# Patient Record
Sex: Male | Born: 1990 | Race: Black or African American | Hispanic: No | Marital: Single | Smoking: Current some day smoker
Health system: Southern US, Community
[De-identification: ages and names within clinical notes are randomized; demographics above are authoritative.]

## PROBLEM LIST (undated history)

## (undated) DIAGNOSIS — F209 Schizophrenia, unspecified: Secondary | ICD-10-CM

## (undated) DIAGNOSIS — F909 Attention-deficit hyperactivity disorder, unspecified type: Secondary | ICD-10-CM

## (undated) DIAGNOSIS — F319 Bipolar disorder, unspecified: Secondary | ICD-10-CM

## (undated) HISTORY — PX: KNEE ARTHROSCOPY: SHX127

---
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
## 2011-01-12 ENCOUNTER — Emergency Department (HOSPITAL_COMMUNITY)
Admission: EM | Admit: 2011-01-12 | Discharge: 2011-01-12 | Disposition: A | Discharge status: Home / Self Care | Payer: Medicaid Other | Attending: Emergency Medicine | Admitting: Emergency Medicine

## 2011-01-12 DIAGNOSIS — L2989 Other pruritus: Secondary | ICD-10-CM | POA: Insufficient documentation

## 2011-01-12 DIAGNOSIS — F909 Attention-deficit hyperactivity disorder, unspecified type: Secondary | ICD-10-CM | POA: Insufficient documentation

## 2011-01-12 DIAGNOSIS — T7840XA Allergy, unspecified, initial encounter: Secondary | ICD-10-CM

## 2011-01-12 DIAGNOSIS — L259 Unspecified contact dermatitis, unspecified cause: Secondary | ICD-10-CM | POA: Insufficient documentation

## 2011-01-12 DIAGNOSIS — I10 Essential (primary) hypertension: Secondary | ICD-10-CM | POA: Insufficient documentation

## 2011-01-12 DIAGNOSIS — R21 Rash and other nonspecific skin eruption: Secondary | ICD-10-CM | POA: Insufficient documentation

## 2011-01-12 DIAGNOSIS — L298 Other pruritus: Secondary | ICD-10-CM | POA: Insufficient documentation

## 2011-01-12 DIAGNOSIS — F319 Bipolar disorder, unspecified: Secondary | ICD-10-CM | POA: Insufficient documentation

## 2011-01-12 HISTORY — DX: Attention-deficit hyperactivity disorder, unspecified type: F90.9

## 2011-01-12 HISTORY — DX: Bipolar disorder, unspecified: F31.9

## 2011-01-12 MED ORDER — PREDNISONE 20 MG PO TABS: 40.0000 mg | ORAL_TABLET | Freq: Every day | ORAL | Status: AC

## 2011-01-12 MED ORDER — DIPHENHYDRAMINE HCL 25 MG PO CAPS
50.0000 mg | ORAL_CAPSULE | Freq: Once | ORAL | Status: AC
  Administered 2011-01-12: 50 mg via ORAL
  Filled 2011-01-12: qty 2

## 2011-01-12 MED ORDER — PREDNISONE 20 MG PO TABS
60.0000 mg | ORAL_TABLET | Freq: Once | ORAL | Status: AC
  Administered 2011-01-12: 60 mg via ORAL
  Filled 2011-01-12: qty 3

## 2011-01-12 NOTE — ED Notes (Signed)
Pt presents with 2 day h/o orbital swelling and facial itching.  Pt is in no acute distress, denies any shortness of breath or difficulty swallowing. Tongue is not swollen.

## 2011-01-12 NOTE — ED Notes (Signed)
Pt presents with 2 day h/o orbital swelling and facial itching.  Pt denies any new medication, food, topicals, soaps or detergent.  Pt denies any shortness of breath or difficulty swallowing.  Pt denies any tongue swelling.

## 2011-01-12 NOTE — ED Provider Notes (Addendum)
History     CSN: [id number] Arrival date & time: 01/12/2011 11:43 AM   First MD Initiated Contact with Patient 01/12/11 1208      Chief Complaint  Patient presents with  . Allergic Reaction    (Consider location/radiation/quality/duration/timing/severity/associated sxs/prior treatment) Patient is a 20 y.o. male presenting with allergic reaction. The history is provided by the patient.  Allergic Reaction The primary symptoms are  rash. The primary symptoms do not include wheezing, shortness of breath, cough, vomiting or dizziness. The current episode started more than 2 days ago. The problem has been gradually worsening. This is a new problem.  The rash is associated with itching.  Associated with: nothing new. Significant symptoms also include itching. Significant symptoms that are not present include eye redness or rhinorrhea. Associated symptoms comments: Swelling around eyes.    Past Medical History  Diagnosis Date  . Bipolar affective   . Attention deficit hyperactivity disorder (ADHD)     Past Surgical History  Procedure Date  . Knee arthroscopy     No family history on file.  History  Substance Use Topics  . Smoking status: Current Everyday Smoker -- 0.5 packs/day  . Smokeless tobacco: Not on file  . Alcohol Use: No      Review of Systems  HENT: Negative for rhinorrhea.   Eyes: Negative for redness.  Respiratory: Negative for cough, shortness of breath and wheezing.   Gastrointestinal: Negative for vomiting.  Skin: Positive for itching and rash.  Neurological: Negative for dizziness.  All other systems reviewed and are negative.    Allergies  Review of patient's allergies indicates no known allergies.  Home Medications   Current Outpatient Rx  Name Route Sig Dispense Refill  . LISDEXAMFETAMINE DIMESYLATE 20 MG PO CAPS Oral Take 20 mg by mouth every morning.      Marland Kitchen QUETIAPINE FUMARATE 100 MG PO TABS Oral Take 100 mg by mouth at bedtime.      Marland Kitchen  RISPERIDONE 3 MG PO TABS Oral Take 3 mg by mouth daily.        BP 105/61  Pulse 68  Temp(Src) 97.3 F (36.3 C) (Oral)  Resp 16  SpO2 99%  Physical Exam  Nursing note and vitals reviewed. Constitutional: He is oriented to person, place, and time. He appears well-developed and well-nourished. No distress.  HENT:  Head: Normocephalic and atraumatic.  Eyes: Conjunctivae and EOM are normal. Pupils are equal, round, and reactive to light.       Periorbital swelling and edema around bilateral eyes  Neck: Normal range of motion. Neck supple.  Cardiovascular: Normal rate, regular rhythm, normal heart sounds and intact distal pulses.   Pulmonary/Chest: Effort normal and breath sounds normal. No respiratory distress. He has no wheezes.  Musculoskeletal: Normal range of motion.  Neurological: He is alert and oriented to person, place, and time.  Skin: Skin is warm, dry and intact. Rash noted. Rash is maculopapular.       Excoriated rash over the entire face and ears.  No crusting or drainage    ED Course  Procedures (including critical care time)  Labs Reviewed - No data to display No results found.   No diagnosis found.    MDM   Pt with contact dermatitis on the face from unknown substance.  Pt denies any new creams, soaps, etc.  No oral involvement other than mild lip swelling but no tongue or uvula swelling.  Will treat with prednisone and bendaryl.  No signs of bacterial infection  at this time.  12:50 PM At discharge pt states that he had knee surgery in 7th grade and it hurts him intermittently.  Denies any pain now and surgical site well healed over the left knee.  No effusion and discussed with him f/u for possible MRI if continues to have intermittent pain however feel x-ray would be low yield due to no injury and not hurting now.      Gwyneth Sprout, MD 01/12/11 1610  Gwyneth Sprout, MD 01/12/11 1251

## 2011-05-02 ENCOUNTER — Ambulatory Visit: Payer: Medicaid Other | Admitting: Sports Medicine

## 2011-08-12 ENCOUNTER — Emergency Department (INDEPENDENT_AMBULATORY_CARE_PROVIDER_SITE_OTHER)
Admission: EM | Admit: 2011-08-12 | Discharge: 2011-08-12 | Disposition: A | Discharge status: Home / Self Care | Payer: Medicaid Other | Source: Home / Self Care | Attending: Family Medicine | Admitting: Family Medicine

## 2011-08-12 ENCOUNTER — Encounter (HOSPITAL_COMMUNITY): Payer: Self-pay | Admitting: *Deleted

## 2011-08-12 DIAGNOSIS — IMO0001 Reserved for inherently not codable concepts without codable children: Secondary | ICD-10-CM

## 2011-08-12 DIAGNOSIS — S40861A Insect bite (nonvenomous) of right upper arm, initial encounter: Secondary | ICD-10-CM

## 2011-08-12 DIAGNOSIS — T148XXA Other injury of unspecified body region, initial encounter: Secondary | ICD-10-CM

## 2011-08-12 MED ORDER — FLUTICASONE PROPIONATE 0.05 % EX CREA: TOPICAL_CREAM | Freq: Two times a day (BID) | CUTANEOUS | Status: AC

## 2011-08-12 MED ORDER — DOXYCYCLINE HYCLATE 100 MG PO CAPS: 100.0000 mg | ORAL_CAPSULE | Freq: Two times a day (BID) | ORAL | Status: DC

## 2011-08-12 NOTE — ED Provider Notes (Signed)
History     CSN: [id number]  Arrival date & time 08/12/11  1157   First MD Initiated Contact with Patient 08/12/11 1225      No chief complaint on file.   (Consider location/radiation/quality/duration/timing/severity/associated sxs/prior treatment) Patient is a 21 y.o. male presenting with abscess. The history is provided by a parent and the patient.  Abscess  This is a new problem. The current episode started yesterday. The problem has been gradually worsening. The abscess is present on the right arm. The problem is mild. The abscess is characterized by painfulness, blistering and draining. The patient was exposed to an insect bite/sting. The abscess first occurred at home.    Past Medical History  Diagnosis Date  . Bipolar affective   . Attention deficit hyperactivity disorder (ADHD)     Past Surgical History  Procedure Date  . Knee arthroscopy     No family history on file.  History  Substance Use Topics  . Smoking status: Current Everyday Smoker -- 0.5 packs/day  . Smokeless tobacco: Not on file  . Alcohol Use: No      Review of Systems  Constitutional: Negative.   Skin: Positive for rash.    Allergies  Review of patient's allergies indicates no known allergies.  Home Medications   Current Outpatient Rx  Name Route Sig Dispense Refill  . DOXYCYCLINE HYCLATE 100 MG PO CAPS Oral Take 1 capsule (100 mg total) by mouth 2 (two) times daily. 20 capsule 0  . FLUTICASONE PROPIONATE 0.05 % EX CREA Topical Apply topically 2 (two) times daily. 30 g 0  . LISDEXAMFETAMINE DIMESYLATE 20 MG PO CAPS Oral Take 20 mg by mouth every morning.      Marland Kitchen QUETIAPINE FUMARATE 100 MG PO TABS Oral Take 100 mg by mouth at bedtime.      Marland Kitchen RISPERIDONE 3 MG PO TABS Oral Take 3 mg by mouth daily.        BP 138/55  Pulse 70  Temp 98.5 F (36.9 C) (Oral)  Resp 17  SpO2 100%  Physical Exam  Nursing note and vitals reviewed. Constitutional: He appears well-developed and  well-nourished.  Skin: Skin is warm and dry. Rash noted.       Tender induration with open blister lesion draining clear fluid to right ulnar forearm, fluid cultured.    ED Course  Procedures (including critical care time)   Labs Reviewed  CULTURE, ROUTINE-ABSCESS   No results found.   1. Insect bite of arm, right       MDM          Linna Hoff, MD 08/12/11 1409

## 2011-08-12 NOTE — Discharge Instructions (Signed)
Warm compress twice a day when you take the antibiotic, take all of medicine, return as needed. °

## 2011-08-12 NOTE — ED Notes (Signed)
Pt with c/o sore area x 2 days right forearm worse today - painful to palpation

## 2011-08-13 ENCOUNTER — Encounter (HOSPITAL_COMMUNITY): Payer: Self-pay | Admitting: Family Medicine

## 2011-08-13 ENCOUNTER — Observation Stay (HOSPITAL_COMMUNITY)
Admission: EM | Admit: 2011-08-13 | Discharge: 2011-08-13 | Disposition: A | Discharge status: Home / Self Care | Payer: Medicaid Other | Source: Ambulatory Visit | Attending: Emergency Medicine | Admitting: Emergency Medicine

## 2011-08-13 DIAGNOSIS — IMO0002 Reserved for concepts with insufficient information to code with codable children: Secondary | ICD-10-CM | POA: Insufficient documentation

## 2011-08-13 DIAGNOSIS — L0291 Cutaneous abscess, unspecified: Secondary | ICD-10-CM

## 2011-08-13 DIAGNOSIS — L0390 Cellulitis, unspecified: Secondary | ICD-10-CM

## 2011-08-13 DIAGNOSIS — F17200 Nicotine dependence, unspecified, uncomplicated: Secondary | ICD-10-CM | POA: Insufficient documentation

## 2011-08-13 LAB — CBC
HCT: 38.8 % — ABNORMAL LOW (ref 39.0–52.0)
Hemoglobin: 13.3 g/dL (ref 13.0–17.0)
MCH: 28.2 pg (ref 26.0–34.0)
MCV: 82.4 fL (ref 78.0–100.0)
Platelets: 194 10*3/uL (ref 150–400)
RBC: 4.71 MIL/uL (ref 4.22–5.81)
RDW: 12.8 % (ref 11.5–15.5)
WBC: 11.7 10*3/uL — ABNORMAL HIGH (ref 4.0–10.5)

## 2011-08-13 LAB — DIFFERENTIAL
Basophils Absolute: 0 10*3/uL (ref 0.0–0.1)
Basophils Relative: 0 % (ref 0–1)
Eosinophils Absolute: 0.1 10*3/uL (ref 0.0–0.7)
Lymphs Abs: 2.3 10*3/uL (ref 0.7–4.0)
Monocytes Absolute: 1.4 10*3/uL — ABNORMAL HIGH (ref 0.1–1.0)
Monocytes Relative: 12 % (ref 3–12)
Neutrophils Relative %: 67 % (ref 43–77)

## 2011-08-13 MED ORDER — VANCOMYCIN HCL IN DEXTROSE 1-5 GM/200ML-% IV SOLN
1000.0000 mg | Freq: Two times a day (BID) | INTRAVENOUS | Status: DC
  Administered 2011-08-13: 1000 mg via INTRAVENOUS
  Filled 2011-08-13: qty 200

## 2011-08-13 MED ORDER — SODIUM CHLORIDE 0.9 % IV SOLN
Freq: Once | INTRAVENOUS | Status: AC
  Administered 2011-08-13: 20 mL/h via INTRAVENOUS

## 2011-08-13 MED ORDER — HYDROCODONE-ACETAMINOPHEN 5-325 MG PO TABS
1.0000 | ORAL_TABLET | ORAL | Status: DC | PRN
  Administered 2011-08-13: 1 via ORAL
  Filled 2011-08-13: qty 1

## 2011-08-13 MED ORDER — VANCOMYCIN HCL IN DEXTROSE 1-5 GM/200ML-% IV SOLN: 1000.0000 mg | Freq: Once | INTRAVENOUS | Status: DC

## 2011-08-13 NOTE — ED Notes (Signed)
Bump on rt lwer arm x 2 days that has been draining has been seen and tx but no better rt hand swollen now

## 2011-08-13 NOTE — Discharge Instructions (Signed)
Abscess An abscess (boil or furuncle) is an infected area under your skin. This area is filled with yellowish white fluid (pus). HOME CARE   Only take medicine as told by your doctor.   Keep the skin clean around your abscess. Keep clothes that may touch the abscess clean.   Change any bandages (dressings) as told by your doctor.   Avoid direct skin contact with other people. The infection can spread by skin contact with others.   Practice good hygiene and do not share personal care items.   Do not share athletic equipment, towels, or whirlpools. Shower after every practice or work out session.   If a draining area cannot be covered:   Do not play sports.   Children should not go to daycare until the wound has healed or until fluid (drainage) stops coming out of the wound.   See your doctor for a follow-up visit as told.  GET HELP RIGHT AWAY IF:   There is more pain, puffiness (swelling), and redness in the wound site.   There is fluid or bleeding from the wound site.   You have muscle aches, chills, fever, or feel sick.   You or your child has a temperature by mouth above 102 F (38.9 C), not controlled by medicine.   Your baby is older than 3 months with a rectal temperature of 102 F (38.9 C) or higher.  MAKE SURE YOU:   Understand these instructions.   Will watch your condition.   Will get help right away if you are not doing well or get worse.  Document Released: 08/02/2007 Document Revised: 02/02/2011 Document Reviewed: 08/02/2007 [facility] Patient Information 2012 [place], [place].Cellulitis Cellulitis is an infection of the tissue under the skin. The infected area is usually red and tender. This is caused by germs. These germs enter the body through cuts or sores. This usually happens in the arms or lower legs. HOME CARE   Take your medicine as told. Finish it even if you start to feel better.   If the infection is on the arm or leg, keep it raised (elevated).     Use a warm cloth on the infected area several times a day.   See your doctor for a follow-up visit as told.  GET HELP RIGHT AWAY IF:   You are tired or confused.   You throw up (vomit).   You have watery poop (diarrhea).   You feel ill and have muscle aches.   You have a fever.  MAKE SURE YOU:   Understand these instructions.   Will watch your condition.   Will get help right away if you are not doing well or get worse.  Document Released: 08/02/2007 Document Revised: 02/02/2011 Document Reviewed: 01/15/2009 [facility] Patient Information 2012 [place], [place].

## 2011-08-13 NOTE — ED Notes (Signed)
Pt sts he was bit by something a few days ago. Pt has extreme arm swelling to right arm with drainage. sts he went to [facility] and they gave him abx but the swelling and pain is getting worse.

## 2011-08-13 NOTE — ED Provider Notes (Addendum)
History  This chart was scribed for Gwyneth Sprout, MD by Cherlynn Perches. The patient was seen in room STRE3/STRE3. Patient's care was started at 0951.  CSN: [id number]  Arrival date & time 08/13/11  4098   First MD Initiated Contact with Patient 08/13/11 1104      Chief Complaint  Patient presents with  . Arm Swelling    (Consider location/radiation/quality/duration/timing/severity/associated sxs/prior treatment) HPI  Joseph Atkinson is a 21 y.o. male who presents to the Emergency Department complaining of 3 days of sudden onset, gradually worsening, moderate to severe swelling to the right forearm with associated right arm pain, drainage to the affected area, and fever. Pt was seen at Urgent Care yesterday and given antibiotics, but states that pain and swelling has gotten worse. Pt believes he was bit by something, but did not see the bite occur. Pt denies vomiting, SOB, and chest pain. Pt is a current everyday smoker.    Past Medical History  Diagnosis Date  . Bipolar affective   . Attention deficit hyperactivity disorder (ADHD)     Past Surgical History  Procedure Date  . Knee arthroscopy     History reviewed. No pertinent family history.  History  Substance Use Topics  . Smoking status: Current Everyday Smoker -- 0.5 packs/day  . Smokeless tobacco: Not on file  . Alcohol Use: No      Review of Systems  Constitutional: Positive for fever. Negative for chills.  HENT: Negative for congestion, sore throat and neck pain.   Respiratory: Negative for cough and shortness of breath.   Cardiovascular: Negative for chest pain.  Gastrointestinal: Negative for nausea and vomiting.  Musculoskeletal: Positive for joint swelling.  Skin:       Red area on arm with drainage  Neurological: Negative for weakness and headaches.  All other systems reviewed and are negative.    Allergies  Review of patient's allergies indicates no known allergies.  Home Medications    Current Outpatient Rx  Name Route Sig Dispense Refill  . DOXYCYCLINE HYCLATE 100 MG PO CAPS Oral Take 1 capsule (100 mg total) by mouth 2 (two) times daily. 20 capsule 0  . FLUTICASONE PROPIONATE 0.05 % EX CREA Topical Apply topically 2 (two) times daily. 30 g 0  . QUETIAPINE FUMARATE 100 MG PO TABS Oral Take 100 mg by mouth at bedtime.      Marland Kitchen RISPERIDONE 3 MG PO TABS Oral Take 3 mg by mouth daily.        BP 126/60  Pulse 93  Temp 99.6 F (37.6 C) (Oral)  Resp 20  SpO2 97%  Physical Exam  Nursing note and vitals reviewed. Constitutional: He is oriented to person, place, and time. He appears well-developed and well-nourished. No distress.  HENT:  Head: Normocephalic and atraumatic.  Eyes: EOM are normal.  Neck: Neck supple. No tracheal deviation present.  Cardiovascular: Normal rate, regular rhythm and normal heart sounds.   Pulmonary/Chest: Effort normal and breath sounds normal. No respiratory distress. He has no wheezes. He has no rales.  Musculoskeletal: Normal range of motion.  Neurological: He is alert and oriented to person, place, and time.  Skin: Skin is warm and dry.       Significant swelling from right hand to above right elbow. Pointing, draining, erythematous area on lateral and proximal forearm. Warmth and erythema surrounding area.  Psychiatric: He has a normal mood and affect. His behavior is normal.    ED Course  Procedures (including critical care time)  DIAGNOSTIC STUDIES: Oxygen Saturation is 97% on room air, adequate by my interpretation.    COORDINATION OF CARE: 11:12AM - Will drain area and give patient IV antibiotics. Will observe patient for 6-12 hours. Pt and mother agree with plan.  INCISION AND DRAINAGE Performed by: Gwyneth Sprout Consent: Verbal consent obtained. Risks and benefits: risks, benefits and alternatives were discussed Type: abscess  Body area: Right forearm  Anesthesia: local infiltration  Local anesthetic: lidocaine 1  % with epinephrine  Anesthetic total: 5 ml  Complexity: complex Blunt dissection to break up loculations  Drainage: purulent  Drainage amount: 5 ML   Packing material: 1/4 in iodoform gauze  Patient tolerance: Patient tolerated the procedure well with no immediate complications.    Labs Reviewed  CBC - Abnormal; Notable for the following:    WBC 11.7 (*)     HCT 38.8 (*)     All other components within normal limits  DIFFERENTIAL - Abnormal; Notable for the following:    Neutro Abs 7.9 (*)     Monocytes Absolute 1.4 (*)     All other components within normal limits   No results found.   No diagnosis found.    MDM   Patient with evidence of an abscess on his own right forearm with surrounding cellulitis and swelling from his hand to his elbow. Abscess with strain with moderate amount of pus expressed. Packing was placed. Patient was started on vancomycin and placed on the cellulitis protocol and will reevaluate.  Continues to drain. On reevaluation once in the arm has improved with persistent swelling. Patient wants to go home. Will have him continue doxycycline and return in 2 days for wound check.      I personally performed the services described in this documentation, which was scribed in my presence.  The recorded information has been reviewed and considered.    Gwyneth Sprout, MD 08/13/11 1127  Gwyneth Sprout, MD 08/13/11 1320  Gwyneth Sprout, MD 08/13/11 1521

## 2011-08-14 ENCOUNTER — Encounter (HOSPITAL_COMMUNITY): Payer: Self-pay | Admitting: Emergency Medicine

## 2011-08-14 ENCOUNTER — Emergency Department (HOSPITAL_COMMUNITY): Payer: Medicaid Other

## 2011-08-14 ENCOUNTER — Inpatient Hospital Stay (HOSPITAL_COMMUNITY)
Admission: EM | Admit: 2011-08-14 | Discharge: 2011-08-16 | DRG: 603 | Disposition: A | Discharge status: Home / Self Care | Payer: Medicaid Other | Attending: Internal Medicine | Admitting: Internal Medicine

## 2011-08-14 DIAGNOSIS — F909 Attention-deficit hyperactivity disorder, unspecified type: Secondary | ICD-10-CM | POA: Insufficient documentation

## 2011-08-14 DIAGNOSIS — L03113 Cellulitis of right upper limb: Secondary | ICD-10-CM

## 2011-08-14 DIAGNOSIS — L0291 Cutaneous abscess, unspecified: Secondary | ICD-10-CM

## 2011-08-14 DIAGNOSIS — A4902 Methicillin resistant Staphylococcus aureus infection, unspecified site: Secondary | ICD-10-CM | POA: Diagnosis present

## 2011-08-14 DIAGNOSIS — Z79899 Other long term (current) drug therapy: Secondary | ICD-10-CM

## 2011-08-14 DIAGNOSIS — F319 Bipolar disorder, unspecified: Secondary | ICD-10-CM | POA: Diagnosis present

## 2011-08-14 DIAGNOSIS — IMO0002 Reserved for concepts with insufficient information to code with codable children: Principal | ICD-10-CM | POA: Diagnosis present

## 2011-08-14 DIAGNOSIS — L02818 Cutaneous abscess of other sites: Secondary | ICD-10-CM

## 2011-08-14 DIAGNOSIS — F17200 Nicotine dependence, unspecified, uncomplicated: Secondary | ICD-10-CM | POA: Diagnosis present

## 2011-08-14 DIAGNOSIS — L02413 Cutaneous abscess of right upper limb: Secondary | ICD-10-CM

## 2011-08-14 DIAGNOSIS — D72829 Elevated white blood cell count, unspecified: Secondary | ICD-10-CM | POA: Diagnosis present

## 2011-08-14 DIAGNOSIS — F209 Schizophrenia, unspecified: Secondary | ICD-10-CM | POA: Insufficient documentation

## 2011-08-14 DIAGNOSIS — L03818 Cellulitis of other sites: Secondary | ICD-10-CM

## 2011-08-14 DIAGNOSIS — L0390 Cellulitis, unspecified: Secondary | ICD-10-CM

## 2011-08-14 HISTORY — DX: Schizophrenia, unspecified: F20.9

## 2011-08-14 LAB — BASIC METABOLIC PANEL
CO2: 25 mEq/L (ref 19–32)
Calcium: 9.4 mg/dL (ref 8.4–10.5)
Creatinine, Ser: 0.96 mg/dL (ref 0.50–1.35)
Glucose, Bld: 100 mg/dL — ABNORMAL HIGH (ref 70–99)

## 2011-08-14 LAB — DIFFERENTIAL
Eosinophils Relative: 1 % (ref 0–5)
Lymphocytes Relative: 19 % (ref 12–46)
Lymphs Abs: 2.1 10*3/uL (ref 0.7–4.0)
Monocytes Absolute: 1 10*3/uL (ref 0.1–1.0)

## 2011-08-14 LAB — CBC
HCT: 38.6 % — ABNORMAL LOW (ref 39.0–52.0)
MCV: 82.3 fL (ref 78.0–100.0)
RBC: 4.69 MIL/uL (ref 4.22–5.81)
RDW: 13 % (ref 11.5–15.5)
WBC: 11.1 10*3/uL — ABNORMAL HIGH (ref 4.0–10.5)

## 2011-08-14 MED ORDER — VANCOMYCIN HCL IN DEXTROSE 1-5 GM/200ML-% IV SOLN
1000.0000 mg | Freq: Once | INTRAVENOUS | Status: AC
  Administered 2011-08-14: 1000 mg via INTRAVENOUS
  Filled 2011-08-14: qty 200

## 2011-08-14 MED ORDER — RISPERIDONE 3 MG PO TABS
3.0000 mg | ORAL_TABLET | Freq: Every day | ORAL | Status: DC
  Administered 2011-08-14 – 2011-08-16 (×3): 3 mg via ORAL
  Filled 2011-08-14 (×3): qty 1

## 2011-08-14 MED ORDER — QUETIAPINE FUMARATE 100 MG PO TABS
100.0000 mg | ORAL_TABLET | Freq: Every day | ORAL | Status: DC
  Administered 2011-08-14 – 2011-08-15 (×2): 100 mg via ORAL
  Filled 2011-08-14 (×3): qty 1

## 2011-08-14 MED ORDER — BISACODYL 5 MG PO TBEC: 5.0000 mg | DELAYED_RELEASE_TABLET | Freq: Every day | ORAL | Status: DC | PRN

## 2011-08-14 MED ORDER — SODIUM CHLORIDE 0.9 % IV SOLN
1500.0000 mg | Freq: Two times a day (BID) | INTRAVENOUS | Status: DC
  Administered 2011-08-14 – 2011-08-15 (×4): 1500 mg via INTRAVENOUS
  Filled 2011-08-14 (×4): qty 1500

## 2011-08-14 MED ORDER — SODIUM CHLORIDE 0.9 % IV BOLUS (SEPSIS)
1000.0000 mL | Freq: Once | INTRAVENOUS | Status: AC
  Administered 2011-08-14: 1000 mL via INTRAVENOUS

## 2011-08-14 MED ORDER — ONDANSETRON HCL 4 MG/2ML IJ SOLN: 4.0000 mg | Freq: Four times a day (QID) | INTRAMUSCULAR | Status: DC | PRN

## 2011-08-14 MED ORDER — HYDROMORPHONE HCL PF 1 MG/ML IJ SOLN
1.0000 mg | Freq: Once | INTRAMUSCULAR | Status: AC
  Administered 2011-08-14: 1 mg via INTRAVENOUS
  Filled 2011-08-14: qty 1

## 2011-08-14 MED ORDER — ACETAMINOPHEN 325 MG PO TABS: 650.0000 mg | ORAL_TABLET | Freq: Four times a day (QID) | ORAL | Status: DC | PRN

## 2011-08-14 MED ORDER — ONDANSETRON HCL 4 MG/2ML IJ SOLN
4.0000 mg | Freq: Once | INTRAMUSCULAR | Status: AC
  Administered 2011-08-14: 4 mg via INTRAVENOUS
  Filled 2011-08-14: qty 2

## 2011-08-14 MED ORDER — ONDANSETRON HCL 4 MG PO TABS: 4.0000 mg | ORAL_TABLET | Freq: Four times a day (QID) | ORAL | Status: DC | PRN

## 2011-08-14 MED ORDER — SODIUM CHLORIDE 0.9 % IV SOLN
INTRAVENOUS | Status: DC
  Administered 2011-08-14: 11:00:00 via INTRAVENOUS
  Administered 2011-08-15: 75 mL via INTRAVENOUS
  Administered 2011-08-15: 09:00:00 via INTRAVENOUS

## 2011-08-14 MED ORDER — ACETAMINOPHEN 650 MG RE SUPP: 650.0000 mg | Freq: Four times a day (QID) | RECTAL | Status: DC | PRN

## 2011-08-14 MED ORDER — HYDROMORPHONE HCL PF 1 MG/ML IJ SOLN
1.0000 mg | INTRAMUSCULAR | Status: DC | PRN
  Administered 2011-08-14 – 2011-08-16 (×6): 1 mg via INTRAVENOUS
  Filled 2011-08-14 (×6): qty 1

## 2011-08-14 NOTE — ED Notes (Signed)
Pt states he has been running a fever for the past two days. Never measured.

## 2011-08-14 NOTE — ED Notes (Signed)
ZOX:[id number]<VW> Expected date:08/14/11<BR> Expected time:12:20 AM<BR> Means of arrival:Ambulance<BR> Comments:<BR> Abscess on arm: triage

## 2011-08-14 NOTE — ED Provider Notes (Signed)
Medical screening examination/treatment/procedure(s) were conducted as a shared visit with non-physician practitioner(s) and myself.  I personally evaluated the patient during the encounter.  Pt with recent I&D, iv abx for cellulitis, but is worsening after d/c.  Arm is swollen, erythematous, but no signs of compartment syndrome or necrotizing fasciitis.  To be admitted for continued IV abx  Olivia Mackie, MD 08/14/11 [phone]
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
## 2011-08-14 NOTE — ED Provider Notes (Signed)
History     CSN: [id number]  Arrival date & time 08/14/11  0030   First MD Initiated Contact with Patient 08/14/11 0240      Chief Complaint  Patient presents with  . Abscess    (Consider location/radiation/quality/duration/timing/severity/associated sxs/prior treatment) HPI Comments: Patient returns tonight with worsening right forearm pain from previously I&D'd abscess - was seen at Urgent care on Saturday and then returned to the [facility] ED today where he got IV abx and pain medication.  States that tonight he noticed fever, chills and worsening drainage from the area, reports pain now radiating into wrist and elbow with increased swelling and redness.    Patient is a 21 y.o. male presenting with abscess. The history is provided by the patient. No language interpreter was used.  Abscess  This is a recurrent problem. The current episode started less than one week ago. The onset was gradual. The problem has been gradually worsening. The abscess is present on the right arm. The problem is severe. The abscess is characterized by redness, painfulness, draining and swelling. The abscess first occurred at home. Associated symptoms include a fever. Pertinent negatives include no anorexia, no decrease in physical activity, not sleeping less, not drinking less, not sleeping more, no diarrhea, no vomiting, no congestion, no rhinorrhea, no sore throat, no decreased responsiveness and no cough. His past medical history does not include skin abscesses in family. There were no sick contacts. Recently, medical care has been given at this facility. Services received include medications given.    Past Medical History  Diagnosis Date  . Bipolar affective   . Attention deficit hyperactivity disorder (ADHD)   . Schizophrenia     Past Surgical History  Procedure Date  . Knee arthroscopy     No family history on file.  History  Substance Use Topics  . Smoking status: Current Everyday Smoker -- 0.5  packs/day  . Smokeless tobacco: Never Used  . Alcohol Use: No      Review of Systems  Constitutional: Positive for fever and chills. Negative for decreased responsiveness.  HENT: Negative for congestion, sore throat and rhinorrhea.   Eyes: Negative for pain.  Respiratory: Negative for cough.   Cardiovascular: Negative for chest pain.  Gastrointestinal: Negative for vomiting, diarrhea and anorexia.  Genitourinary: Negative for dysuria.  Musculoskeletal: Positive for myalgias. Negative for back pain.  Neurological: Negative for dizziness and headaches.  All other systems reviewed and are negative.    Allergies  Review of patient's allergies indicates no known allergies.  Home Medications   Current Outpatient Rx  Name Route Sig Dispense Refill  . DOXYCYCLINE HYCLATE 100 MG PO CAPS Oral Take 1 capsule (100 mg total) by mouth 2 (two) times daily. 20 capsule 0  . FLUTICASONE PROPIONATE 0.05 % EX CREA Topical Apply topically 2 (two) times daily. 30 g 0  . QUETIAPINE FUMARATE 100 MG PO TABS Oral Take 100 mg by mouth at bedtime.      Marland Kitchen RISPERIDONE 3 MG PO TABS Oral Take 3 mg by mouth daily.        BP 131/56  Pulse 82  Temp 98.9 F (37.2 C) (Oral)  Resp 18  Ht 6' (1.829 m)  Wt 185 lb (83.915 kg)  BMI 25.09 kg/m2  SpO2 97%  Physical Exam  Nursing note and vitals reviewed. Constitutional: He is oriented to person, place, and time. He appears well-developed and well-nourished. No distress.  HENT:  Head: Normocephalic and atraumatic.  Right Ear: External ear  normal.  Left Ear: External ear normal.  Nose: Nose normal.  Mouth/Throat: Oropharynx is clear and moist. No oropharyngeal exudate.  Eyes: Conjunctivae are normal. Pupils are equal, round, and reactive to light. No scleral icterus.  Neck: Normal range of motion. Neck supple.  Cardiovascular: Normal rate, regular rhythm and normal heart sounds.  Exam reveals no gallop and no friction rub.   No murmur  heard. Pulmonary/Chest: Effort normal and breath sounds normal. No respiratory distress. He has no wheezes. He has no rales. He exhibits no tenderness.  Abdominal: Soft. Bowel sounds are normal. He exhibits no distension. There is no tenderness.  Musculoskeletal: He exhibits edema and tenderness.       Arms:      2+ radial and ulnar pulse, pain with supination and pronation of forearm, pain with dorsiflexion of wrist  Lymphadenopathy:    He has no cervical adenopathy.  Neurological: He is alert and oriented to person, place, and time. No cranial nerve deficit. He exhibits normal muscle tone. Coordination normal.  Skin: Skin is warm and dry. There is erythema.  Psychiatric: He has a normal mood and affect. His behavior is normal. Judgment and thought content normal.    ED Course  Procedures (including critical care time)  Labs Reviewed  CBC - Abnormal; Notable for the following:    WBC 11.1 (*)     HCT 38.6 (*)     All other components within normal limits  DIFFERENTIAL - Abnormal; Notable for the following:    Neutro Abs 8.0 (*)     All other components within normal limits  BASIC METABOLIC PANEL - Abnormal; Notable for the following:    Glucose, Bld 100 (*)     All other components within normal limits   Dg Forearm Right  08/14/2011  *RADIOLOGY REPORT*  Clinical Data: Open wound on the posterior right forearm.  RIGHT FOREARM - 2 VIEW  Comparison: Right hand radiographs performed 10/16/2004  Findings: The radius and ulna appear intact.  There is no evidence of fracture or dislocation.  The elbow joint is grossly unremarkable in appearance; no elbow joint effusion is identified.  The carpal rows appear grossly intact, demonstrate normal alignment.  Soft tissue swelling is noted along the dorsum of the forearm, with an associated focal soft tissue defect.  IMPRESSION:  1.  No evidence of fracture or dislocation. 2.  Soft tissue swelling along the dorsum of the forearm, with an associated  focal soft tissue defect.  Original Report Authenticated By: Tonia Ghent, M.D.     Right forearm cellulitis   MDM  Patient returns with worsening right forearm cellulitis with fever and leukocytosis, believe this to be failure of outpatient treatment.  Have spoken with Dr. Kaylyn Layer with [facility] who will admit, will likely need to get hand consult on him for possible tenosynovitis.        Izola Price [place], [place] 08/14/11 [phone]

## 2011-08-14 NOTE — Progress Notes (Signed)
ANTIBIOTIC CONSULT NOTE - INITIAL  Pharmacy Consult for Vancomycin  Indication: cellulitis   No Known Allergies  Patient Measurements: Height: 6' (182.9 cm) Weight: 185 lb (83.915 kg) IBW/kg (Calculated) : 77.6    Vital Signs: Temp: 98.5 F (36.9 C) (06/17 1011) Temp src: Oral (06/17 1011) BP: 118/50 mmHg (06/17 1011) Pulse Rate: 67  (06/17 1011) Intake/Output from previous day:   Intake/Output from this shift:    Labs:  Basename 08/14/11 0355 08/13/11 1124  WBC 11.1* 11.7*  HGB 13.0 13.3  PLT 201 194  LABCREA -- --  CREATININE 0.96 --   Estimated Creatinine Clearance: 135.8 ml/min (by C-G formula based on Cr of 0.96). No results found for this basename: VANCOTROUGH:2,VANCOPEAK:2,VANCORANDOM:2,GENTTROUGH:2,GENTPEAK:2,GENTRANDOM:2,TOBRATROUGH:2,TOBRAPEAK:2,TOBRARND:2,AMIKACINPEAK:2,AMIKACINTROU:2,AMIKACIN:2, in the last 72 hours   Microbiology: Recent Results (from the past 720 hour(s))  CULTURE, ROUTINE-ABSCESS     Status: Normal (Preliminary result)   Collection Time   08/12/11  2:06 PM      Component Value Range Status Comment   Specimen Description ABSCESS RIGHT ARM   Final    Special Requests NONE   Final    Gram Stain PENDING   Incomplete    Culture     Final    Value: MODERATE STAPHYLOCOCCUS AUREUS     Note: RIFAMPIN AND GENTAMICIN SHOULD NOT BE USED AS SINGLE DRUGS FOR TREATMENT OF STAPH INFECTIONS.   Report Status PENDING   Incomplete     Medical History: Past Medical History  Diagnosis Date  . Bipolar affective   . Attention deficit hyperactivity disorder (ADHD)   . Schizophrenia     Medications:  Scheduled:    .  HYDROmorphone (DILAUDID) injection  1 mg Intravenous Once  . ondansetron (ZOFRAN) IV  4 mg Intravenous Once  . QUEtiapine  100 mg Oral QHS  . risperiDONE  3 mg Oral Daily  . sodium chloride  1,000 mL Intravenous Once  . vancomycin  1,500 mg Intravenous Q12H  . vancomycin  1,000 mg Intravenous Once   Infusions:    . sodium  chloride     PRN: acetaminophen, acetaminophen, bisacodyl, HYDROmorphone (DILAUDID) injection, ondansetron (ZOFRAN) IV, ondansetron Assessment:  21 yo M with right forearm cellulitis, recently treated as out pt with I&D and doxycycline.  Pt. now presents with worsening drainage, swelling, and low-grade fever.  Per MD note, prelim culture results from 6/25 show moderate staph.  Xray shows reports no evidence of osteo.   Patient with good renal function, CrCL > 100 ml/min; Weight 83 kg  No current micro data pending in Epic - will f/u with out patient culture growing staph.   WBC just slightly elevated at 11.1  Afebrile   Goal of Therapy:  Vancomycin trough level 10-15 mcg/ml  Plan:  1.) Vancomycin 1500 mg IV q12h, first dose now 2.) Vancomycin troughs at [facility], monitor renal function 3.) Follow renal fnx  Avonna Iribe, [place] 08/14/2011,11:06 AM

## 2011-08-14 NOTE — H&P (Signed)
I have seen and examined the pt at bedside, appreciate ortho input. Agree with above's assessment and plan.  Debbora Presto, MD  [facility] Pager [phone]  If 7PM-7AM, please contact night-coverage [email or web address] Password [email or web address]

## 2011-08-14 NOTE — Consult Note (Signed)
  Called to see patient for right forearm abcsess drained in ER last pm  Bedside exam shows drainage from proximal right forearm  Would start BID bedside hydrotherapy and local wound care and repeat exam tomorrow    Will follow with you
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
## 2011-08-15 DIAGNOSIS — L02818 Cutaneous abscess of other sites: Secondary | ICD-10-CM

## 2011-08-15 DIAGNOSIS — L03818 Cellulitis of other sites: Secondary | ICD-10-CM

## 2011-08-15 LAB — BASIC METABOLIC PANEL
Calcium: 8.4 mg/dL (ref 8.4–10.5)
GFR calc Af Amer: >90 mL/min (ref >90)
GFR calc non Af Amer: >90 mL/min (ref >90)
Glucose, Bld: 114 mg/dL — ABNORMAL HIGH (ref 70–99)
Potassium: 3.7 mEq/L (ref 3.5–5.1)
Sodium: 136 mEq/L (ref 135–145)

## 2011-08-15 LAB — VANCOMYCIN, TROUGH: Vancomycin Tr: 8.9 ug/mL — ABNORMAL LOW (ref 10.0–20.0)

## 2011-08-15 LAB — CBC
Hemoglobin: 11.5 g/dL — ABNORMAL LOW (ref 13.0–17.0)
MCH: 27.4 pg (ref 26.0–34.0)
Platelets: 192 10*3/uL (ref 150–400)
RBC: 4.19 MIL/uL — ABNORMAL LOW (ref 4.22–5.81)
WBC: 5.9 10*3/uL (ref 4.0–10.5)

## 2011-08-15 LAB — CULTURE, ROUTINE-ABSCESS

## 2011-08-15 MED ORDER — SULFAMETHOXAZOLE-TRIMETHOPRIM 800-160 MG PO TABS: 1.0000 | ORAL_TABLET | Freq: Two times a day (BID) | ORAL | Status: DC

## 2011-08-15 NOTE — Progress Notes (Signed)
Patient ID: Joseph Atkinson, male   DOB: [birth year], 21 y.o.   MRN: [id number] Doing much better today clinically   WBC 5k  Pain and induration significantly decreased  Will need to see in my office on Tuesday 6/25

## 2011-08-15 NOTE — Progress Notes (Signed)
Physical Therapy Hydrotherapy 08/15/11 1231  Subjective Assessment  Subjective is this going to hurt?  Patient and Family Stated Goals to go home  Date of Onset 08/11/11  Evaluation and Treatment  Evaluation and Treatment Procedures Explained to Patient/Family Yes  Evaluation and Treatment Procedures agreed to  Wound 08/14/11 Other (Comment) Arm Right wound approx 2" diameter with deep open center, reddened area around wound,  Date First Assessed/Time First Assessed: 08/14/11 0715   Wound Type: (c) Other (Comment)  Location: Arm  Location Orientation: Right  Wound Description (Comments): wound approx 2" diameter with deep open center, reddened area around wound,  Present on Ad  Site / Wound Assessment Red;Granulation tissue  % Wound base Red or Granulating 100%  % Wound base Yellow 0%  % Wound base Black 0%  Peri-wound Assessment Denuded;Induration;Maceration;Edema  Wound Length (cm) 1 cm  Wound Width (cm) 1 cm  Wound Depth (cm) 0.5 cm  Undermining (cm) .25cm from 6-9 oclock  Margins Unattacted edges (unapproximated)  Closure None  Drainage Amount Minimal  Drainage Description Serosanguineous  Treatment Hydrotherapy (Pulse lavage);Cleansed;Packing (Saline gauze)  Dressing Type Moist to dry with one 2x2 saline guaze;dry 2x2 Gauze ;Barrier Film (skin prep);Tape dressing  Dressing Changed Changed  Hydrotherapy  Pulsed Lavage with Suction (psi) 8 psi  Pulsed Lavage with Suction - Normal Saline Used 1000 mL  Pulsed Lavage Tip Tip with splash shield  Pulsed lavage therapy - wound location right proximal lateral forearm  Wound Therapy - Assess/Plan/Recommendations  Wound Therapy - Clinical Statement pt reports a ruptured boil on right forearm that ruptured. Pt with edema throughout right arm, elbow, forearm.  Pt instructed in ROM exercises and elevation.    Hydrotherapy Plan Pulsatile lavage with suction;Dressing change;Other (comment) (will continue with PLS qd mon-sat while impatient)    Wound Therapy - Frequency 6X / week  Wound Therapy - Follow Up Recommendations Wound Care Center  Wound Plan wound cleansing, dressing and d/c planning  Wound Therapy Goals - Improve the function of patient's integumentary system by progressing the wound(s) through the phases of wound healing by:  Decrease Length/Width/Depth by (cm) dcrease depth and undermining to 0 cm  Decrease Length/Width/Depth - Progress Goal set today  Patient/Family will be able to  verbalize wound care follow up  Patient/Family Instruction Goal - Progress Goal set today  Additional Wound Therapy Goal pt will demonstrate ROM and edema contol strategies  Additional Wound Therapy Goal - Progress Goal set today  Goals/treatment plan/discharge plan were made with and agreed upon by patient/family Yes  Time For Goal Achievement 3 days  Wound Therapy - Potential for Goals Excellent
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
## 2011-08-15 NOTE — Discharge Instructions (Signed)

## 2011-08-15 NOTE — Consult Note (Signed)
WOC will not consult as it appears ortho has ordered wound care and hydrotherapy and will follow up on outpt basis for continued management of this wound.  Re consult if needed, will not follow at this time. Thanks  Bertha Earwood [employer], CWOCN [phone])

## 2011-08-15 NOTE — Progress Notes (Signed)
UR complete 

## 2011-08-16 DIAGNOSIS — L02818 Cutaneous abscess of other sites: Secondary | ICD-10-CM

## 2011-08-16 DIAGNOSIS — L03818 Cellulitis of other sites: Secondary | ICD-10-CM

## 2011-08-16 MED ORDER — SODIUM CHLORIDE 0.9 % IV SOLN
1250.0000 mg | Freq: Three times a day (TID) | INTRAVENOUS | Status: DC
  Administered 2011-08-16: 1250 mg via INTRAVENOUS
  Filled 2011-08-16 (×3): qty 1250

## 2011-08-16 MED ORDER — SULFAMETHOXAZOLE-TRIMETHOPRIM 800-160 MG PO TABS: 1.0000 | ORAL_TABLET | Freq: Two times a day (BID) | ORAL | Status: AC

## 2011-08-16 MED ORDER — OXYCODONE-ACETAMINOPHEN 5-325 MG PO TABS: 1.0000 | ORAL_TABLET | ORAL | Status: AC | PRN

## 2011-08-16 NOTE — Progress Notes (Signed)
Subjective: No new issues. Comfortable, pain-wise.   Objective: Vital signs in last 24 hours: Temp:  [97.6 F (36.4 C)-98.2 F (36.8 C)] 97.8 F (36.6 C) (06/19 0556) Pulse Rate:  [60-64] 62  (06/19 0556) Resp:  [18] 18  (06/19 0556) BP: (112-123)/(47-57) 112/57 mmHg (06/19 0556) SpO2:  [100 %] 100 % (06/19 0556) Weight change:  Last BM Date:  (pt does not know, PTA)  Intake/Output from previous day: 06/18 0701 - 06/19 0700 In: 2883.8 [P.O.:960; I.V.:1923.8] Out: 2550 [Urine:2550] Total I/O In: 160 [P.O.:160] Out: -    Physical Exam: General: Comfortable, alert, communicative, fully oriented, not short of breath at rest.  HEENT:  No clinical pallor, no jaundice, no conjunctival injection or discharge. NECK:  Supple, JVP not seen, no carotid bruits, no palpable lymphadenopathy, no palpable goiter. CHEST:  Clinically clear to auscultation, no wheezes, no crackles. HEART:  Sounds 1 and 2 heard, normal, regular, no murmurs. ABDOMEN:  Full, soft, non-tender, no palpable organomegaly, no palpable masses, normal bowel sounds. GENITALIA:  Not examined. UPPER EXTREMITIES: RUE has reduced swelling/erythema. Dressings are dry. LUE is unremarkable.  LOWER EXTREMITIES:  No pitting edema, palpable peripheral pulses. MUSCULOSKELETAL SYSTEM:  Unremarkable. CENTRAL NERVOUS SYSTEM:  No focal neurologic deficit on gross examination.  Lab Results:  Basename 08/15/11 0350 08/14/11 0355  WBC 5.9 11.1*  HGB 11.5* 13.0  HCT 35.2* 38.6*  PLT 192 201    Basename 08/15/11 0350 08/14/11 0355  NA 136 135  K 3.7 3.5  CL 103 99  CO2 27 25  GLUCOSE 114* 100*  BUN 8 10  CREATININE 0.88 0.96  CALCIUM 8.4 9.4   Recent Results (from the past 240 hour(s))  CULTURE, ROUTINE-ABSCESS     Status: Normal   Collection Time   08/12/11  2:06 PM      Component Value Range Status Comment   Specimen Description ABSCESS RIGHT ARM   Final    Special Requests NONE   Final    Gram Stain     Final    Value: RARE WBC PRESENT, PREDOMINANTLY MONONUCLEAR     NO SQUAMOUS EPITHELIAL CELLS SEEN     RARE GRAM POSITIVE COCCI     IN CLUSTERS   Culture     Final    Value: MODERATE METHICILLIN RESISTANT STAPHYLOCOCCUS AUREUS     Note: RIFAMPIN AND GENTAMICIN SHOULD NOT BE USED AS SINGLE DRUGS FOR TREATMENT OF STAPH INFECTIONS. This organism DOES NOT demonstrate inducible Clindamycin resistance in vitro. CRITICAL RESULT CALLED TO, READ BACK BY AND VERIFIED WITH: KRISTIN RN      06.18.13 1410 BY BPERCN   Report Status 08/15/2011 FINAL   Final    Organism ID, Bacteria METHICILLIN RESISTANT STAPHYLOCOCCUS AUREUS   Final      Studies/Results: No results found.  Medications: Scheduled Meds:   . QUEtiapine  100 mg Oral QHS  . risperiDONE  3 mg Oral Daily  . vancomycin  1,250 mg Intravenous Q8H  . DISCONTD: vancomycin  1,500 mg Intravenous Q12H   Continuous Infusions:   . sodium chloride 75 mL (08/15/11 2347)   PRN Meds:.acetaminophen, acetaminophen, bisacodyl, HYDROmorphone (DILAUDID) injection, ondansetron (ZOFRAN) IV, ondansetron  Assessment/Plan:  Active Problems:  1. Abscess of forearm, right/Cellulitis and abscess: This is due to MRSA, confirmed on wound/abscess culture. Managed with iv Vancomycin initially. To be transitioned to 2 weeks of oral Bactrim DS on discharge. S/B Dr Mina Marble, hand surgeon, on consultation. Recommended hydrotherapy. F/U scheduled ion office, on 08/22/11.  2. Psychiatric  issues: Known history of of bipolar disorder, ADHD and schizophrenia. Stable, clinically.   Comment: Stable for discharge on 08/16/11. See Discharge summary of 08/15/11, with addendum.    LOS: 2 days   Joseph Atkinson,Joseph Atkinson 08/16/2011, 1:37 PM

## 2011-08-16 NOTE — Progress Notes (Addendum)
08/16/11 0900  Subjective Assessment  Subjective "one more day"  Patient and Family Stated Goals go home with family  Date of Onset 08/11/11  Evaluation and Treatment  Evaluation and Treatment Procedures Explained to Patient/Family Yes  Evaluation and Treatment Procedures agreed to  Wound 08/14/11 Other (Comment) Arm Right wound approx 2" diameter with deep open center, reddened area around wound,  Date First Assessed/Time First Assessed: 08/14/11 0715   Wound Type: (c) Other (Comment)  Location: Arm  Location Orientation: Right  Wound Description (Comments): wound approx 2" diameter with deep open center, reddened area around wound,  Present on Ad  Site / Wound Assessment Red;Granulation tissue  % Wound base Red or Granulating 100%  % Wound base Yellow 0%  % Wound base Black 0%  Peri-wound Assessment Denuded;Induration;Maceration;Edema  Margins Unattacted edges (unapproximated)  Closure None  Drainage Amount Minimal  Drainage Description Serosanguineous  Dressing Type Moist to dry;Gauze (Comment);Barrier Film (skin prep);Tape dressing  Hydrotherapy  Pulsed Lavage with Suction (psi) 8 psi  Pulsed Lavage with Suction - Normal Saline Used 1000 mL  Pulsed Lavage Tip Tip with splash shield  Pulsed lavage therapy - wound location right proximal lateral forearm  Wound Therapy - Assess/Plan/Recommendations  Wound Therapy - Clinical Statement pt reports a ruptured boil on right forearm that ruptured. Pt with edema throughout right arm, elbow, forearm.  Pt instructed in ROM exercises and elevation.    Hydrotherapy Plan Pulsatile lavage with suction;Dressing change;Other (comment) (will continue with PLS qd mon-sat while impatient)  Wound Therapy - Frequency 6X / week  Wound Therapy - Follow Up Recommendations [facility]  Wound Plan wound cleansing, dressing and d/c planning  Wound Therapy Goals - Improve the function of patient's integumentary system by progressing the wound(s) through  the phases of wound healing by:  Decrease Length/Width/Depth by (cm) dcrease depth and undermining to 0 cm  Patient/Family will be able to  verbalize wound care follow up  Additional Wound Therapy Goal pt will demonstrate ROM and edema contol strategies  Goals/treatment plan/discharge plan were made with and agreed upon by patient/family Yes  Time For Goal Achievement 2 days;3 days  Wound Therapy - Potential for Goals Excellent   Felecia Shelling  PTA [facility]  Acute  Rehab Pager     [phone]

## 2011-08-16 NOTE — Progress Notes (Signed)
ANTIBIOTIC CONSULT NOTE - FOLLOW UP  Pharmacy Consult for vancomycin Indication: cellulitis  No Known Allergies  Patient Measurements: Height: 6' (182.9 cm) Weight: 185 lb (83.915 kg) IBW/kg (Calculated) : 77.6  Adjusted Body Weight:   Vital Signs: Temp: 97.6 F (36.4 C) (06/18 2233) Temp src: Oral (06/18 2233) BP: 123/56 mmHg (06/18 2233) Pulse Rate: 60  (06/18 2233) Intake/Output from previous day: 06/18 0701 - 06/19 0700 In: 600 [P.O.:600] Out: 1950 [Urine:1950] Intake/Output from this shift: Total I/O In: 360 [P.O.:360] Out: 1300 [Urine:1300]  Labs:  [facility] 08/15/11 0350 08/14/11 0355 08/13/11 1124  WBC 5.9 11.1* 11.7*  HGB 11.5* 13.0 13.3  PLT 192 201 194  LABCREA -- -- --  CREATININE 0.88 0.96 --   Estimated Creatinine Clearance: 148.2 ml/min (by C-G formula based on Cr of 0.88).  Basename 08/15/11 2315  VANCOTROUGH 8.9*  VANCOPEAK --  Drue Dun --  GENTTROUGH --  GENTPEAK --  GENTRANDOM --  TOBRATROUGH --  TOBRAPEAK --  TOBRARND --  AMIKACINPEAK --  AMIKACINTROU --  AMIKACIN --     Microbiology: Recent Results (from the past 720 hour(s))  CULTURE, ROUTINE-ABSCESS     Status: Normal   Collection Time   08/12/11  2:06 PM      Component Value Range Status Comment   Specimen Description ABSCESS RIGHT ARM   Final    Special Requests NONE   Final    Gram Stain     Final    Value: RARE WBC PRESENT, PREDOMINANTLY MONONUCLEAR     NO SQUAMOUS EPITHELIAL CELLS SEEN     RARE GRAM POSITIVE COCCI     IN CLUSTERS   Culture     Final    Value: MODERATE METHICILLIN RESISTANT STAPHYLOCOCCUS AUREUS     Note: RIFAMPIN AND GENTAMICIN SHOULD NOT BE USED AS SINGLE DRUGS FOR TREATMENT OF STAPH INFECTIONS. This organism DOES NOT demonstrate inducible Clindamycin resistance in vitro. CRITICAL RESULT CALLED TO, READ BACK BY AND VERIFIED WITH: KRISTIN RN      06.18.13 1410 BY BPERCN   Report Status 08/15/2011 FINAL   Final    Organism ID, Bacteria METHICILLIN  RESISTANT STAPHYLOCOCCUS AUREUS   Final     Anti-infectives     Start     Dose/Rate Route Frequency Ordered Stop   08/16/11 0600   vancomycin (VANCOCIN) 1,250 mg in sodium chloride 0.9 % 250 mL IVPB        1,250 mg 166.7 mL/hr over 90 Minutes Intravenous Every 8 hours 08/16/11 0207     08/15/11 0000  sulfamethoxazole-trimethoprim (BACTRIM DS) 800-160 MG per tablet       1 tablet Oral 2 times daily 08/15/11 1909 08/25/11 2359   08/14/11 1200   vancomycin (VANCOCIN) 1,500 mg in sodium chloride 0.9 % 500 mL IVPB  Status:  Discontinued        1,500 mg 250 mL/hr over 120 Minutes Intravenous Every 12 hours 08/14/11 1106 08/16/11 0207   08/14/11 0330   vancomycin (VANCOCIN) IVPB 1000 mg/200 mL premix        1,000 mg 200 mL/hr over 60 Minutes Intravenous  Once 08/14/11 0322 08/14/11 0532          Assessment: Patient with low vancomycin level.    Goal of Therapy:  Vancomycin trough level 10-15 mcg/ml  Plan:  Measure antibiotic drug levels at steady state Follow up culture results Change to 1250mg  iv q8hr vancomycin  Darlina Guys, Jacquenette Shone Crowford 08/16/2011,2:07 AM

## 2011-08-17 ENCOUNTER — Telehealth (HOSPITAL_COMMUNITY): Payer: Self-pay | Admitting: *Deleted

## 2011-08-17 NOTE — ED Notes (Signed)
Abscess culture R arm: Mod. MRSA. Pt. adequately treated with Bactrim DS and was admitted and treated with IV Vancomycin. I called pt. Pt. verified x 2 and given results.  Pt. told he was adequately treated and to finish all of the antibiotics.  Pt. given MRSA instructions.  Pt. told to f/u with Dr. Mina Marble as instructed. Vassie Moselle 08/17/2011

## 2013-09-06 ENCOUNTER — Encounter (HOSPITAL_COMMUNITY): Payer: Self-pay | Admitting: Emergency Medicine

## 2013-09-06 ENCOUNTER — Emergency Department (HOSPITAL_COMMUNITY)
Admission: EM | Admit: 2013-09-06 | Discharge: 2013-09-06 | Disposition: A | Discharge status: Home / Self Care | Payer: Medicaid Other | Attending: Emergency Medicine | Admitting: Emergency Medicine

## 2013-09-06 DIAGNOSIS — L0501 Pilonidal cyst with abscess: Secondary | ICD-10-CM | POA: Insufficient documentation

## 2013-09-06 DIAGNOSIS — L089 Local infection of the skin and subcutaneous tissue, unspecified: Secondary | ICD-10-CM | POA: Diagnosis present

## 2013-09-06 DIAGNOSIS — F17200 Nicotine dependence, unspecified, uncomplicated: Secondary | ICD-10-CM | POA: Insufficient documentation

## 2013-09-06 DIAGNOSIS — Z791 Long term (current) use of non-steroidal anti-inflammatories (NSAID): Secondary | ICD-10-CM | POA: Insufficient documentation

## 2013-09-06 DIAGNOSIS — Z79899 Other long term (current) drug therapy: Secondary | ICD-10-CM | POA: Diagnosis not present

## 2013-09-06 DIAGNOSIS — F319 Bipolar disorder, unspecified: Secondary | ICD-10-CM | POA: Diagnosis not present

## 2013-09-06 DIAGNOSIS — Z8659 Personal history of other mental and behavioral disorders: Secondary | ICD-10-CM | POA: Diagnosis not present

## 2013-09-06 MED ORDER — SULFAMETHOXAZOLE-TRIMETHOPRIM 800-160 MG PO TABS: 1.0000 | ORAL_TABLET | Freq: Two times a day (BID) | ORAL | Status: DC

## 2013-09-06 MED ORDER — NAPROXEN 500 MG PO TABS: 500.0000 mg | ORAL_TABLET | Freq: Two times a day (BID) | ORAL | Status: DC

## 2013-09-06 NOTE — Discharge Instructions (Signed)
Pilonidal Cyst A pilonidal cyst occurs when hairs get trapped (ingrown) beneath the skin in the crease between the buttocks over your sacrum (the bone under that crease). Pilonidal cysts are most common in young men with a lot of body hair. When the cyst is ruptured (breaks) or leaking, fluid from the cyst may cause burning and itching. If the cyst becomes infected, it causes a painful swelling filled with pus (abscess). The pus and trapped hairs need to be removed (often by lancing) so that the infection can heal. However, recurrence is common and an operation may be needed to remove the cyst. HOME CARE INSTRUCTIONS   If the cyst was NOT INFECTED:  Keep the area clean and dry. Bathe or shower daily. Wash the area well with a germ-killing soap. Warm tub baths may help prevent infection and help with drainage. Dry the area well with a towel.  Avoid tight clothing to keep area as moisture free as possible.  Keep area between buttocks as free of hair as possible. A depilatory may be used.  If the cyst WAS INFECTED and needed to be drained:  Your caregiver packed the wound with gauze to keep the wound open. This allows the wound to heal from the inside outwards and continue draining.  Return for a wound check in 1 day or as suggested.  If you take tub baths or showers, repack the wound with gauze following them. Sponge baths (at the sink) are a good alternative.  If an antibiotic was ordered to fight the infection, take as directed.  Only take over-the-counter or prescription medicines for pain, discomfort, or fever as directed by your caregiver.  After the drain is removed, use sitz baths for 20 minutes 4 times per day. Clean the wound gently with mild unscented soap, pat dry, and then apply a dry dressing. SEEK MEDICAL CARE IF:   You have increased pain, swelling, redness, drainage, or bleeding from the area.  You have a fever.  You have muscles aches, dizziness, or a general ill  feeling. Document Released: 02/11/2000 Document Revised: 05/08/2011 Document Reviewed: 04/10/2008 ExitCare Patient Information 2015 [employer]. This information is not intended to replace advice given to you by your health care provider. Make sure you discuss any questions you have with your health care provider.  

## 2013-09-06 NOTE — ED Provider Notes (Signed)
CSN: [id number]     Arrival date & time 09/06/13  1813 History   First MD Initiated Contact with Patient 09/06/13 2007     Chief Complaint  Patient presents with  . Recurrent Skin Infections     (Consider location/radiation/quality/duration/timing/severity/associated sxs/prior Treatment) HPI Comments: Patient is a 23 year old male with history of bipolar disorder, schizophrenia, and ADHD who presents today ED for 3 days of gradually worsening abscess. He reports this abscess is on his buttocks, worse on the right side. Sitting and walking make the pain worse. He does not have pain with bowel movements. There is no drainage from the area. He has not taken any medications to improve his symptoms. No pain with bowel movements. He had a bowel movement today which was normal for him. He denies fevers, chills, nausea, vomiting, abdominal pain.   The history is provided by the patient. No language interpreter was used.    Past Medical History  Diagnosis Date  . Bipolar affective   . Attention deficit hyperactivity disorder (ADHD)   . Schizophrenia    Past Surgical History  Procedure Laterality Date  . Knee arthroscopy     No family history on file. History  Substance Use Topics  . Smoking status: Current Every Day Smoker -- 0.25 packs/day    Types: Cigarettes  . Smokeless tobacco: Never Used  . Alcohol Use: No    Review of Systems  Constitutional: Negative for fever and chills.  Respiratory: Negative for shortness of breath.   Cardiovascular: Negative for chest pain.  Gastrointestinal: Negative for nausea, vomiting and abdominal pain.  Skin:       abscess  All other systems reviewed and are negative.     Allergies  Review of patient's allergies indicates no known allergies.  Home Medications   Prior to Admission medications   Medication Sig Start Date End Date Taking? Authorizing Provider  PRESCRIPTION MEDICATION Inject 1 each into the muscle every 14 (fourteen) days.  "Risperidone injection"   Yes Historical Provider, MD  risperiDONE (RISPERDAL) 2 MG tablet Take 2 mg by mouth at bedtime.   Yes Historical Provider, MD  traZODone (DESYREL) 100 MG tablet Take 150 mg by mouth at bedtime as needed for sleep.   Yes Historical Provider, MD  naproxen (NAPROSYN) 500 MG tablet Take 1 tablet (500 mg total) by mouth 2 (two) times daily with a meal. 09/06/13   Mora Bellman, PA-C  sulfamethoxazole-trimethoprim (SEPTRA DS) 800-160 MG per tablet Take 1 tablet by mouth 2 (two) times daily. 09/06/13   Ramon Dredge Drey Shaff, PA-C   BP 128/60  Pulse 86  Temp(Src) 98.5 F (36.9 C) (Oral)  Resp 20  Ht 6' (1.829 m)  Wt 222 lb (100.699 kg)  BMI 30.10 kg/m2  SpO2 99% Physical Exam  Nursing note and vitals reviewed. Constitutional: He is oriented to person, place, and time. He appears well-developed and well-nourished. No distress.  HENT:  Head: Normocephalic and atraumatic.  Right Ear: External ear normal.  Left Ear: External ear normal.  Nose: Nose normal.  Mouth/Throat: Oropharynx is clear and moist.  Eyes: Conjunctivae are normal.  Neck: Normal range of motion. Neck supple. No tracheal deviation present.  Cardiovascular: Normal rate, regular rhythm and normal heart sounds.   Pulmonary/Chest: Effort normal and breath sounds normal. No stridor.  Abdominal: Soft. He exhibits no distension. There is no tenderness.  Genitourinary:  Patient with 3 cm area of fluctuance on right buttocks near gluteal fold. No communication with rectum or  anus.   Musculoskeletal: Normal range of motion.  Neurological: He is alert and oriented to person, place, and time.  Skin: Skin is warm and dry. He is not diaphoretic.  Psychiatric: He has a normal mood and affect. His behavior is normal.    ED Course  Procedures (including critical care time) Labs Review Labs Reviewed - No data to display  Imaging Review No results found.   EKG Interpretation None      INCISION AND  DRAINAGE Performed by: Junious SilkMerrell, Eliazar Olivar Consent: Verbal consent obtained. Risks and benefits: risks, benefits and alternatives were discussed Type: abscess  Body area: gluteal cleft  Anesthesia: local infiltration  Incision was made with a scalpel.  Local anesthetic: lidocaine 2%  Anesthetic total: 3 ml  Complexity: complex Blunt dissection to break up loculations  Drainage: purulent  Drainage amount: copious  Packing material: 1/4 in iodoform gauze  Patient tolerance: Patient tolerated the procedure well with no immediate complications.     MDM   Final diagnoses:  Pilonidal abscess    Patient with skin abscess amenable to incision and drainage. No concern for perianal or perirectal abscess. Wound recheck in 2 days. Encouraged home warm soaks and flushing.  Mild signs of cellulitis is surrounding skin.  Will d/c to home.  Discussed reasons to return to ED immediately. Vital signs stable for discharge. Patient / Family / Caregiver informed of clinical course, understand medical decision-making process, and agree with plan.   Mora BellmanHannah S Elmarie Devlin, PA-C 09/08/13 1547

## 2013-09-06 NOTE — ED Notes (Signed)
The pt has had a boil on his buttocks for 2-3 days.  The pain is getting worse making it difficult to walk and sit

## 2013-09-10 NOTE — ED Provider Notes (Signed)
Medical screening examination/treatment/procedure(s) were performed by non-physician practitioner and as supervising physician I was immediately available for consultation/collaboration.   EKG Interpretation None       Doug SouSam Raylinn Kosar, MD 09/10/13 [phone]

## 2015-05-02 ENCOUNTER — Emergency Department (HOSPITAL_COMMUNITY): Payer: Self-pay

## 2015-05-02 ENCOUNTER — Inpatient Hospital Stay (HOSPITAL_COMMUNITY)
Admission: EM | Admit: 2015-05-02 | Discharge: 2015-05-10 | DRG: 957 | Disposition: A | Discharge status: Home Health | Payer: Self-pay | Attending: General Surgery | Admitting: General Surgery

## 2015-05-02 ENCOUNTER — Emergency Department (HOSPITAL_COMMUNITY): Payer: Self-pay | Admitting: Anesthesiology

## 2015-05-02 ENCOUNTER — Encounter (HOSPITAL_COMMUNITY): Admission: EM | Disposition: A | Discharge status: Home Health | Payer: Self-pay | Source: Home / Self Care

## 2015-05-02 ENCOUNTER — Encounter (HOSPITAL_COMMUNITY): Payer: Self-pay | Admitting: *Deleted

## 2015-05-02 DIAGNOSIS — S36593A Other injury of sigmoid colon, initial encounter: Principal | ICD-10-CM | POA: Diagnosis present

## 2015-05-02 DIAGNOSIS — S71141A Puncture wound with foreign body, right thigh, initial encounter: Secondary | ICD-10-CM | POA: Diagnosis present

## 2015-05-02 DIAGNOSIS — W3400XA Accidental discharge from unspecified firearms or gun, initial encounter: Secondary | ICD-10-CM

## 2015-05-02 DIAGNOSIS — S3739XA Other injury of urethra, initial encounter: Secondary | ICD-10-CM | POA: Diagnosis present

## 2015-05-02 DIAGNOSIS — F17200 Nicotine dependence, unspecified, uncomplicated: Secondary | ICD-10-CM | POA: Diagnosis present

## 2015-05-02 DIAGNOSIS — S36498A Other injury of other part of small intestine, initial encounter: Secondary | ICD-10-CM | POA: Diagnosis present

## 2015-05-02 DIAGNOSIS — S36409A Unspecified injury of unspecified part of small intestine, initial encounter: Secondary | ICD-10-CM | POA: Diagnosis present

## 2015-05-02 DIAGNOSIS — S36509A Unspecified injury of unspecified part of colon, initial encounter: Secondary | ICD-10-CM | POA: Diagnosis present

## 2015-05-02 DIAGNOSIS — D62 Acute posthemorrhagic anemia: Secondary | ICD-10-CM | POA: Diagnosis not present

## 2015-05-02 DIAGNOSIS — S3133XA Puncture wound without foreign body of scrotum and testes, initial encounter: Secondary | ICD-10-CM | POA: Diagnosis present

## 2015-05-02 DIAGNOSIS — S71142A Puncture wound with foreign body, left thigh, initial encounter: Secondary | ICD-10-CM | POA: Diagnosis present

## 2015-05-02 DIAGNOSIS — K659 Peritonitis, unspecified: Secondary | ICD-10-CM | POA: Diagnosis present

## 2015-05-02 DIAGNOSIS — S3123XA Puncture wound without foreign body of penis, initial encounter: Secondary | ICD-10-CM | POA: Diagnosis present

## 2015-05-02 DIAGNOSIS — S3994XA Unspecified injury of external genitals, initial encounter: Secondary | ICD-10-CM | POA: Diagnosis present

## 2015-05-02 HISTORY — PX: LAPAROTOMY: SHX154

## 2015-05-02 HISTORY — PX: CYSTOSCOPY: SHX5120

## 2015-05-02 LAB — PREPARE FRESH FROZEN PLASMA
Unit division: 0
Unit division: 0

## 2015-05-02 LAB — CBC
HCT: 35.7 % — ABNORMAL LOW (ref 39.0–52.0)
HCT: 28.8 % — ABNORMAL LOW (ref 39.0–52.0)
Hemoglobin: 11.5 g/dL — ABNORMAL LOW (ref 13.0–17.0)
Hemoglobin: 9.7 g/dL — ABNORMAL LOW (ref 13.0–17.0)
MCH: 27.4 pg (ref 26.0–34.0)
MCH: 28.4 pg (ref 26.0–34.0)
MCHC: 32.2 g/dL (ref 30.0–36.0)
MCHC: 33.7 g/dL (ref 30.0–36.0)
MCV: 85.2 fL (ref 78.0–100.0)
MCV: 84.5 fL (ref 78.0–100.0)
PLATELETS: 161 10*3/uL (ref 150–400)
PLATELETS: 167 10*3/uL (ref 150–400)
RBC: 4.19 MIL/uL — AB (ref 4.22–5.81)
RBC: 3.41 MIL/uL — AB (ref 4.22–5.81)
RDW: 14.1 % (ref 11.5–15.5)
RDW: 14.3 % (ref 11.5–15.5)
WBC: 11.2 10*3/uL — ABNORMAL HIGH (ref 4.0–10.5)
WBC: 14.4 10*3/uL — AB (ref 4.0–10.5)

## 2015-05-02 LAB — COMPREHENSIVE METABOLIC PANEL
ALT: 26 U/L (ref 17–63)
AST: 50 U/L — AB (ref 15–41)
Albumin: 3.8 g/dL (ref 3.5–5.0)
Alkaline Phosphatase: 68 U/L (ref 38–126)
Anion gap: 16 — ABNORMAL HIGH (ref 5–15)
BUN: 10 mg/dL (ref 6–20)
CALCIUM: 9.2 mg/dL (ref 8.9–10.3)
CO2: 22 mmol/L (ref 22–32)
CREATININE: 1.07 mg/dL (ref 0.61–1.24)
Chloride: 104 mmol/L (ref 101–111)
GFR calc non Af Amer: >60 mL/min (ref >60)
GLUCOSE: 111 mg/dL — AB (ref 65–99)
Potassium: 3.2 mmol/L — ABNORMAL LOW (ref 3.5–5.1)
SODIUM: 142 mmol/L (ref 135–145)
Total Bilirubin: 0.2 mg/dL — ABNORMAL LOW (ref 0.3–1.2)
Total Protein: 6.7 g/dL (ref 6.5–8.1)

## 2015-05-02 LAB — POCT I-STAT 7, (LYTES, BLD GAS, ICA,H+H)
ACID-BASE DEFICIT: 4 mmol/L — AB (ref 0.0–2.0)
Bicarbonate: 21.7 mEq/L (ref 20.0–24.0)
CALCIUM ION: 1.11 mmol/L — AB (ref 1.12–1.23)
HCT: 31 % — ABNORMAL LOW (ref 39.0–52.0)
HEMOGLOBIN: 10.5 g/dL — AB (ref 13.0–17.0)
O2 Saturation: 100 %
PH ART: 7.34 — AB (ref 7.350–7.450)
POTASSIUM: 3.8 mmol/L (ref 3.5–5.1)
SODIUM: 140 mmol/L (ref 135–145)
TCO2: 23 mmol/L (ref 0–100)
pCO2 arterial: 39.4 mmHg (ref 35.0–45.0)
pO2, Arterial: 454 mmHg — ABNORMAL HIGH (ref 80.0–100.0)

## 2015-05-02 LAB — BASIC METABOLIC PANEL
Anion gap: 10 (ref 5–15)
BUN: 9 mg/dL (ref 6–20)
CO2: 22 mmol/L (ref 22–32)
Calcium: 8 mg/dL — ABNORMAL LOW (ref 8.9–10.3)
Chloride: 106 mmol/L (ref 101–111)
Creatinine, Ser: 0.94 mg/dL (ref 0.61–1.24)
GFR calc Af Amer: >60 mL/min (ref >60)
Glucose, Bld: 193 mg/dL — ABNORMAL HIGH (ref 65–99)
POTASSIUM: 3.9 mmol/L (ref 3.5–5.1)
SODIUM: 138 mmol/L (ref 135–145)

## 2015-05-02 LAB — MRSA PCR SCREENING: MRSA BY PCR: NEGATIVE

## 2015-05-02 LAB — CDS SEROLOGY

## 2015-05-02 LAB — PROTIME-INR
INR: 1.29 (ref 0.00–1.49)
Prothrombin Time: 16.2 seconds — ABNORMAL HIGH (ref 11.6–15.2)

## 2015-05-02 LAB — ABO/RH: ABO/RH(D): A POS

## 2015-05-02 LAB — ETHANOL: ALCOHOL ETHYL (B): 97 mg/dL — AB (ref <5)

## 2015-05-02 SURGERY — LAPAROTOMY, EXPLORATORY
Anesthesia: General | Site: Penis

## 2015-05-02 MED ORDER — SUGAMMADEX SODIUM 200 MG/2ML IV SOLN
INTRAVENOUS | Status: AC
  Filled 2015-05-02: qty 2

## 2015-05-02 MED ORDER — FENTANYL CITRATE (PF) 250 MCG/5ML IJ SOLN
INTRAMUSCULAR | Status: DC | PRN
  Administered 2015-05-02 (×4): 50 ug via INTRAVENOUS
  Administered 2015-05-02 (×2): 100 ug via INTRAVENOUS
  Administered 2015-05-02 (×2): 50 ug via INTRAVENOUS

## 2015-05-02 MED ORDER — HYDROMORPHONE HCL 1 MG/ML IJ SOLN
INTRAMUSCULAR | Status: AC
  Filled 2015-05-02: qty 1

## 2015-05-02 MED ORDER — DEXTROSE-NACL 5-0.9 % IV SOLN
INTRAVENOUS | Status: DC
  Administered 2015-05-02: 09:00:00 via INTRAVENOUS
  Administered 2015-05-03: 1000 mL via INTRAVENOUS
  Administered 2015-05-03 – 2015-05-07 (×8): via INTRAVENOUS

## 2015-05-02 MED ORDER — FENTANYL CITRATE (PF) 250 MCG/5ML IJ SOLN
INTRAMUSCULAR | Status: AC
  Filled 2015-05-02: qty 5

## 2015-05-02 MED ORDER — ONDANSETRON HCL 4 MG/2ML IJ SOLN
INTRAMUSCULAR | Status: AC
Start: 2015-05-02 — End: 2015-05-02
  Filled 2015-05-02: qty 2

## 2015-05-02 MED ORDER — HYDROMORPHONE 1 MG/ML IV SOLN
INTRAVENOUS | Status: DC
  Administered 2015-05-02: 10:00:00 via INTRAVENOUS
  Administered 2015-05-02: 0.9 mg via INTRAVENOUS
  Administered 2015-05-02: 0.9 mL via INTRAVENOUS
  Administered 2015-05-03: 0.3 mg via INTRAVENOUS
  Administered 2015-05-03: 0.9 mg via INTRAVENOUS
  Administered 2015-05-03: 0.6 mL via INTRAVENOUS
  Administered 2015-05-03: 0.3 mg via INTRAVENOUS
  Administered 2015-05-03 – 2015-05-04 (×2): 0.9 mg via INTRAVENOUS
  Administered 2015-05-04: 2.1 mg via INTRAVENOUS
  Filled 2015-05-02: qty 25

## 2015-05-02 MED ORDER — LIDOCAINE HCL (CARDIAC) 20 MG/ML IV SOLN
INTRAVENOUS | Status: DC | PRN
  Administered 2015-05-02: 100 mg via INTRATRACHEAL

## 2015-05-02 MED ORDER — ONDANSETRON HCL 4 MG/2ML IJ SOLN: 4.0000 mg | Freq: Four times a day (QID) | INTRAMUSCULAR | Status: DC | PRN

## 2015-05-02 MED ORDER — DIPHENHYDRAMINE HCL 12.5 MG/5ML PO ELIX: 12.5000 mg | ORAL_SOLUTION | Freq: Four times a day (QID) | ORAL | Status: DC | PRN

## 2015-05-02 MED ORDER — SODIUM CHLORIDE 0.9 % IV SOLN
INTRAVENOUS | Status: DC | PRN
  Administered 2015-05-02: 04:00:00 via INTRAVENOUS

## 2015-05-02 MED ORDER — SUCCINYLCHOLINE CHLORIDE 20 MG/ML IJ SOLN
INTRAMUSCULAR | Status: DC | PRN
  Administered 2015-05-02: 120 mg via INTRAVENOUS

## 2015-05-02 MED ORDER — SUGAMMADEX SODIUM 200 MG/2ML IV SOLN
INTRAVENOUS | Status: DC | PRN
  Administered 2015-05-02: 200 mg via INTRAVENOUS

## 2015-05-02 MED ORDER — DEXTROSE 5 % IV SOLN
2.0000 g | Freq: Two times a day (BID) | INTRAVENOUS | Status: AC
  Administered 2015-05-02 (×2): 2 g via INTRAVENOUS
  Filled 2015-05-02 (×3): qty 2

## 2015-05-02 MED ORDER — SODIUM CHLORIDE 0.9% FLUSH: 9.0000 mL | INTRAVENOUS | Status: DC | PRN

## 2015-05-02 MED ORDER — DIPHENHYDRAMINE HCL 50 MG/ML IJ SOLN: 12.5000 mg | Freq: Four times a day (QID) | INTRAMUSCULAR | Status: DC | PRN

## 2015-05-02 MED ORDER — CEFOTETAN DISODIUM-DEXTROSE 2-2.08 GM-% IV SOLR
INTRAVENOUS | Status: AC
  Filled 2015-05-02: qty 50

## 2015-05-02 MED ORDER — ENOXAPARIN SODIUM 40 MG/0.4ML ~~LOC~~ SOLN
40.0000 mg | SUBCUTANEOUS | Status: DC
  Administered 2015-05-02 – 2015-05-06 (×5): 40 mg via SUBCUTANEOUS
  Filled 2015-05-02 (×5): qty 0.4

## 2015-05-02 MED ORDER — LACTATED RINGERS IV SOLN
INTRAVENOUS | Status: DC | PRN
  Administered 2015-05-02 (×3): via INTRAVENOUS

## 2015-05-02 MED ORDER — 0.9 % SODIUM CHLORIDE (POUR BTL) OPTIME
TOPICAL | Status: DC | PRN
Start: 2015-05-02 — End: 2015-05-02
  Administered 2015-05-02: 5000 mL

## 2015-05-02 MED ORDER — TETANUS-DIPHTH-ACELL PERTUSSIS 5-2.5-18.5 LF-MCG/0.5 IM SUSP: 0.5000 mL | Freq: Once | INTRAMUSCULAR | Status: DC

## 2015-05-02 MED ORDER — ONDANSETRON HCL 4 MG/2ML IJ SOLN
INTRAMUSCULAR | Status: DC | PRN
  Administered 2015-05-02: 4 mg via INTRAVENOUS

## 2015-05-02 MED ORDER — ROCURONIUM BROMIDE 100 MG/10ML IV SOLN
INTRAVENOUS | Status: DC | PRN
  Administered 2015-05-02: 50 mg via INTRAVENOUS
  Administered 2015-05-02: 20 mg via INTRAVENOUS

## 2015-05-02 MED ORDER — CEFAZOLIN SODIUM 1-5 GM-% IV SOLN: 1.0000 g | Freq: Once | INTRAVENOUS | Status: DC

## 2015-05-02 MED ORDER — PROMETHAZINE HCL 25 MG/ML IJ SOLN: 6.2500 mg | INTRAMUSCULAR | Status: DC | PRN

## 2015-05-02 MED ORDER — PHENYLEPHRINE HCL 10 MG/ML IJ SOLN
INTRAMUSCULAR | Status: DC | PRN
  Administered 2015-05-02 (×4): 160 ug via INTRAVENOUS

## 2015-05-02 MED ORDER — HYDROMORPHONE HCL 1 MG/ML IJ SOLN
0.2500 mg | INTRAMUSCULAR | Status: DC | PRN
  Administered 2015-05-02: 0.5 mg via INTRAVENOUS

## 2015-05-02 MED ORDER — NALOXONE HCL 0.4 MG/ML IJ SOLN: 0.4000 mg | INTRAMUSCULAR | Status: DC | PRN

## 2015-05-02 MED ORDER — DEXTROSE 5 % IV SOLN
2.0000 g | INTRAVENOUS | Status: DC | PRN
  Administered 2015-05-02: 2 g via INTRAVENOUS

## 2015-05-02 MED ORDER — PROPOFOL 10 MG/ML IV BOLUS
INTRAVENOUS | Status: DC | PRN
  Administered 2015-05-02: 200 mg via INTRAVENOUS

## 2015-05-02 SURGICAL SUPPLY — 59 items
BAG URIMETER 350ML BARDEX IC (UROLOGICAL SUPPLIES) ×1
BAG URIMETER BARDEX IC 350 (UROLOGICAL SUPPLIES) ×1 IMPLANT
BLADE SURG ROTATE 9660 (MISCELLANEOUS) IMPLANT
BNDG COHESIVE 1X5 TAN STRL LF (GAUZE/BANDAGES/DRESSINGS) ×2 IMPLANT
BNDG GAUZE ELAST 4 BULKY (GAUZE/BANDAGES/DRESSINGS) ×4 IMPLANT
CHLORAPREP W/TINT 26ML (MISCELLANEOUS) ×4 IMPLANT
COVER SURGICAL LIGHT HANDLE (MISCELLANEOUS) ×4 IMPLANT
DRAPE LAPAROSCOPIC ABDOMINAL (DRAPES) ×4 IMPLANT
DRAPE WARM FLUID 44X44 (DRAPE) ×4 IMPLANT
DRSG OPSITE POSTOP 4X10 (GAUZE/BANDAGES/DRESSINGS) IMPLANT
DRSG OPSITE POSTOP 4X8 (GAUZE/BANDAGES/DRESSINGS) IMPLANT
ELECT BLADE 6.5 EXT (BLADE) IMPLANT
ELECT CAUTERY BLADE 6.4 (BLADE) ×4 IMPLANT
ELECT REM PT RETURN 9FT ADLT (ELECTROSURGICAL) ×4
ELECTRODE REM PT RTRN 9FT ADLT (ELECTROSURGICAL) ×2 IMPLANT
GAUZE XEROFORM 5X9 LF (GAUZE/BANDAGES/DRESSINGS) ×2 IMPLANT
GLOVE BIO SURGEON STRL SZ7.5 (GLOVE) ×4 IMPLANT
GLOVE BIO SURGEON STRL SZ8 (GLOVE) ×2 IMPLANT
GLOVE BIOGEL PI IND STRL 8 (GLOVE) ×2 IMPLANT
GLOVE BIOGEL PI INDICATOR 8 (GLOVE) ×6
GOWN STRL REUS W/ TWL LRG LVL3 (GOWN DISPOSABLE) ×2 IMPLANT
GOWN STRL REUS W/ TWL XL LVL3 (GOWN DISPOSABLE) ×2 IMPLANT
GOWN STRL REUS W/TWL LRG LVL3 (GOWN DISPOSABLE) ×8
GOWN STRL REUS W/TWL XL LVL3 (GOWN DISPOSABLE) ×4
KIT BASIN OR (CUSTOM PROCEDURE TRAY) ×4 IMPLANT
KIT ROOM TURNOVER OR (KITS) ×4 IMPLANT
LIGASURE IMPACT 36 18CM CVD LR (INSTRUMENTS) ×2 IMPLANT
NS IRRIG 1000ML POUR BTL (IV SOLUTION) ×8 IMPLANT
PACK GENERAL/GYN (CUSTOM PROCEDURE TRAY) ×4 IMPLANT
PACK SURGICAL SETUP 50X90 (CUSTOM PROCEDURE TRAY) ×2 IMPLANT
PAD ABD 8X10 STRL (GAUZE/BANDAGES/DRESSINGS) ×2 IMPLANT
PAD ARMBOARD 7.5X6 YLW CONV (MISCELLANEOUS) ×4 IMPLANT
PLUG CATH AND CAP STER (CATHETERS) ×2 IMPLANT
RELOAD PROXIMATE 75MM BLUE (ENDOMECHANICALS) ×12 IMPLANT
RELOAD STAPLE 75 3.8 BLU REG (ENDOMECHANICALS) IMPLANT
SET IRRIG Y TYPE TUR BLADDER L (SET/KITS/TRAYS/PACK) ×4 IMPLANT
SPECIMEN JAR LARGE (MISCELLANEOUS) ×2 IMPLANT
SPONGE GAUZE 4X4 12PLY STER LF (GAUZE/BANDAGES/DRESSINGS) ×6 IMPLANT
SPONGE LAP 18X18 X RAY DECT (DISPOSABLE) ×8 IMPLANT
STAPLER PROXIMATE 75MM BLUE (STAPLE) ×2 IMPLANT
SUCTION FRAZIER HANDLE 10FR (MISCELLANEOUS) ×2
SUCTION POOLE TIP (SUCTIONS) ×4 IMPLANT
SUCTION TUBE FRAZIER 10FR DISP (MISCELLANEOUS) IMPLANT
SUT CHROMIC 3 0 SH 27 (SUTURE) ×4 IMPLANT
SUT CHROMIC 4 0 PS 2 18 (SUTURE) ×10 IMPLANT
SUT CHROMIC 4 0 RB 1X27 (SUTURE) ×8 IMPLANT
SUT PDS AB 1 TP1 96 (SUTURE) ×8 IMPLANT
SUT SILK 2 0 SH CR/8 (SUTURE) ×2 IMPLANT
SUT SILK 2 0 TIES 10X30 (SUTURE) ×4 IMPLANT
SUT SILK 3 0 SH CR/8 (SUTURE) ×6 IMPLANT
SUT SILK 3 0 TIES 10X30 (SUTURE) ×4 IMPLANT
SYRINGE 10CC LL (SYRINGE) ×4 IMPLANT
TAPE CLOTH SURG 6X10 WHT LF (GAUZE/BANDAGES/DRESSINGS) ×6 IMPLANT
TOWEL OR 17X24 6PK STRL BLUE (TOWEL DISPOSABLE) ×4 IMPLANT
TOWEL OR 17X26 10 PK STRL BLUE (TOWEL DISPOSABLE) ×6 IMPLANT
TRAY FOLEY CATH 16FRSI W/METER (SET/KITS/TRAYS/PACK) ×2 IMPLANT
TUBE CONNECTING 12'X1/4 (SUCTIONS) ×1
TUBE CONNECTING 12X1/4 (SUCTIONS) ×1 IMPLANT
YANKAUER SUCT BULB TIP NO VENT (SUCTIONS) IMPLANT
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
## 2015-05-02 NOTE — ED Notes (Signed)
Portable xrays of his entire body.  hed is not going to be scanned he is going to the or whenever they are rfeady
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
## 2015-05-02 NOTE — ED Notes (Signed)
Dr Derrell Lollingramirez here

## 2015-05-02 NOTE — ED Notes (Signed)
Mother drf steinl talked to her

## 2015-05-02 NOTE — Anesthesia Postprocedure Evaluation (Signed)
Anesthesia Post Note  Patient: Joseph Atkinson  Procedure(s) Performed: Procedure(s) (LRB): EXPLORATORY LAPAROTOMY, SMALL BOWEL RESECTION, RIGHT COLOECTOMY, COLONORRHAPHY (N/A) CYSTOSCOPY, SCROTAL EXPLORATION, CUTANEOUS URETHROSTOMY.. (N/A)  Patient location during evaluation: PACU Anesthesia Type: General Level of consciousness: awake Pain management: pain level controlled Vital Signs Assessment: post-procedure vital signs reviewed and stable Respiratory status: spontaneous breathing Cardiovascular status: stable Anesthetic complications: no    Last Vitals:  Filed Vitals:   05/02/15 0730 05/02/15 0804  BP:  131/54  Pulse: 93   Temp:    Resp: 20 19    Last Pain:  Filed Vitals:   05/02/15 0818  PainSc: Asleep                 EDWARDS,Angeleah Labrake
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
## 2015-05-02 NOTE — Progress Notes (Signed)
Utilization Review Completed.Joseph Atkinson T3/06/2015  

## 2015-05-02 NOTE — Transfer of Care (Signed)
Immediate Anesthesia Transfer of Care Note  Patient: Joseph Atkinson  Procedure(s) Performed: Procedure(s): EXPLORATORY LAPAROTOMY, SMALL BOWEL RESECTION, RIGHT COLOECTOMY, COLONORRHAPHY (N/A) CYSTOSCOPY, SCROTAL EXPLORATION, CUTANEOUS URETHROSTOMY.. (N/A)  Patient Location: PACU  Anesthesia Type:General  Level of Consciousness: awake, alert  and oriented  Airway & Oxygen Therapy: Patient connected to nasal cannula oxygen  Post-op Assessment: Report given to RN and Post -op Vital signs reviewed and stable  Post vital signs: Reviewed and stable  Last Vitals:  Filed Vitals:   05/02/15 0249 05/02/15 0307  BP: 102/60 113/69  Pulse: 66 77  Temp: 37 C   Resp: 20     Complications: No apparent anesthesia complications
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
## 2015-05-02 NOTE — Progress Notes (Signed)
Patient ID: Joseph Atkinson, male   DOB: 1991/02/03, 25 y.o.   MRN: [id number]  Patient stable post op I discussed the operative findings with the patient's family

## 2015-05-02 NOTE — Anesthesia Procedure Notes (Signed)
Procedure Name: Intubation Date/Time: 05/02/2015 3:40 AM Performed by: Molli HazardGORDON, Bernell Sigal M Pre-anesthesia Checklist: Patient identified, Emergency Drugs available, Suction available and Patient being monitored Patient Re-evaluated:Patient Re-evaluated prior to inductionOxygen Delivery Method: Circle system utilized Preoxygenation: Pre-oxygenation with 100% oxygen Intubation Type: IV induction, Rapid sequence and Cricoid Pressure applied Laryngoscope Size: Miller and 2 Grade View: Grade I Tube type: Subglottic suction tube Tube size: 7.5 mm Number of attempts: 1 Airway Equipment and Method: Stylet Placement Confirmation: ETT inserted through vocal cords under direct vision,  positive ETCO2 and breath sounds checked- equal and bilateral Secured at: 23 cm Tube secured with: Tape Dental Injury: Teeth and Oropharynx as per pre-operative assessment

## 2015-05-02 NOTE — Anesthesia Preprocedure Evaluation (Addendum)
Anesthesia Evaluation  Patient identified by MRN, date of birth, ID band Patient awake    Reviewed: Allergy & Precautions, NPO status , Patient's Chart, lab work & pertinent test resultsPreop documentation limited or incomplete due to emergent nature of procedure.  Airway Mallampati: II  TM Distance: >3 FB Neck ROM: Full    Dental   Pulmonary neg pulmonary ROS, Current Smoker,    breath sounds clear to auscultation       Cardiovascular  Rhythm:Regular Rate:Normal     Neuro/Psych    GI/Hepatic   Endo/Other    Renal/GU      Musculoskeletal   Abdominal   Peds  Hematology   Anesthesia Other Findings   Reproductive/Obstetrics                           No results found for: WBC, HGB, HCT, MCV, PLT No results found for: CREATININE, BUN, NA, K, CL, CO2  Anesthesia Physical Anesthesia Plan  ASA: IV and emergent  Anesthesia Plan: General   Post-op Pain Management:    Induction: Intravenous, Rapid sequence and Cricoid pressure planned  Airway Management Planned: Oral ETT  Additional Equipment:   Intra-op Plan:   Post-operative Plan: Possible Post-op intubation/ventilation  Informed Consent: I have reviewed the patients History and Physical, chart, labs and discussed the procedure including the risks, benefits and alternatives for the proposed anesthesia with the patient or authorized representative who has indicated his/her understanding and acceptance.   Dental advisory given  Plan Discussed with: CRNA  Anesthesia Plan Comments:       Anesthesia Quick Evaluation
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
## 2015-05-02 NOTE — ED Notes (Signed)
To or 

## 2015-05-03 ENCOUNTER — Encounter (HOSPITAL_COMMUNITY): Payer: Self-pay | Admitting: General Surgery

## 2015-05-03 LAB — URINALYSIS, ROUTINE W REFLEX MICROSCOPIC
BILIRUBIN URINE: NEGATIVE
GLUCOSE, UA: NEGATIVE mg/dL
HGB URINE DIPSTICK: NEGATIVE
KETONES UR: NEGATIVE mg/dL
Leukocytes, UA: NEGATIVE
Nitrite: NEGATIVE
PROTEIN: NEGATIVE mg/dL
Specific Gravity, Urine: 1.015 (ref 1.005–1.030)
pH: 7.5 (ref 5.0–8.0)

## 2015-05-03 LAB — BLOOD PRODUCT ORDER (VERBAL) VERIFICATION

## 2015-05-03 MED ORDER — CHLORHEXIDINE GLUCONATE 0.12 % MT SOLN
15.0000 mL | Freq: Two times a day (BID) | OROMUCOSAL | Status: DC
  Administered 2015-05-03 – 2015-05-09 (×10): 15 mL via OROMUCOSAL
  Filled 2015-05-03 (×9): qty 15

## 2015-05-03 MED ORDER — CETYLPYRIDINIUM CHLORIDE 0.05 % MT LIQD
7.0000 mL | Freq: Two times a day (BID) | OROMUCOSAL | Status: DC
  Administered 2015-05-05 – 2015-05-06 (×4): 7 mL via OROMUCOSAL

## 2015-05-03 MED ORDER — ACETAMINOPHEN 650 MG RE SUPP
650.0000 mg | Freq: Four times a day (QID) | RECTAL | Status: DC | PRN
  Administered 2015-05-03: 650 mg via RECTAL
  Filled 2015-05-03: qty 1
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
## 2015-05-04 LAB — CBC WITH DIFFERENTIAL/PLATELET
BASOS ABS: 0 10*3/uL (ref 0.0–0.1)
Basophils Relative: 0 %
EOS PCT: 0 %
Eosinophils Absolute: 0 10*3/uL (ref 0.0–0.7)
HCT: 23.4 % — ABNORMAL LOW (ref 39.0–52.0)
Hemoglobin: 7.5 g/dL — ABNORMAL LOW (ref 13.0–17.0)
Lymphocytes Relative: 17 %
Lymphs Abs: 1.2 10*3/uL (ref 0.7–4.0)
MCH: 27.4 pg (ref 26.0–34.0)
MCHC: 32.1 g/dL (ref 30.0–36.0)
MCV: 85.4 fL (ref 78.0–100.0)
MONO ABS: 0.7 10*3/uL (ref 0.1–1.0)
MONOS PCT: 10 %
NEUTROS ABS: 5.5 10*3/uL (ref 1.7–7.7)
NEUTROS PCT: 74 %
PLATELETS: 130 10*3/uL — AB (ref 150–400)
RBC: 2.74 MIL/uL — ABNORMAL LOW (ref 4.22–5.81)
RDW: 13.9 % (ref 11.5–15.5)
WBC: 7.5 10*3/uL (ref 4.0–10.5)

## 2015-05-04 LAB — BASIC METABOLIC PANEL
ANION GAP: 10 (ref 5–15)
CALCIUM: 8.3 mg/dL — AB (ref 8.9–10.3)
CO2: 28 mmol/L (ref 22–32)
CREATININE: 0.83 mg/dL (ref 0.61–1.24)
Chloride: 101 mmol/L (ref 101–111)
GFR calc Af Amer: >60 mL/min (ref >60)
GLUCOSE: 133 mg/dL — AB (ref 65–99)
Potassium: 3.5 mmol/L (ref 3.5–5.1)
Sodium: 139 mmol/L (ref 135–145)

## 2015-05-04 MED ORDER — FENTANYL CITRATE (PF) 100 MCG/2ML IJ SOLN
100.0000 ug | INTRAMUSCULAR | Status: DC | PRN
  Administered 2015-05-04 – 2015-05-06 (×22): 100 ug via INTRAVENOUS
  Filled 2015-05-04 (×23): qty 2

## 2015-05-04 MED ORDER — HYDROMORPHONE HCL 1 MG/ML IJ SOLN
1.0000 mg | INTRAMUSCULAR | Status: DC | PRN
  Administered 2015-05-04 (×4): 2 mg via INTRAVENOUS
  Filled 2015-05-04 (×4): qty 2

## 2015-05-04 MED ORDER — DIPHENHYDRAMINE HCL 50 MG/ML IJ SOLN
12.5000 mg | Freq: Four times a day (QID) | INTRAMUSCULAR | Status: DC | PRN
  Administered 2015-05-04 – 2015-05-10 (×13): 12.5 mg via INTRAVENOUS
  Filled 2015-05-04 (×14): qty 1

## 2015-05-04 MED ORDER — BACITRACIN-NEOMYCIN-POLYMYXIN OINTMENT TUBE
TOPICAL_OINTMENT | Freq: Two times a day (BID) | CUTANEOUS | Status: DC
  Administered 2015-05-04 – 2015-05-07 (×6): via TOPICAL
  Administered 2015-05-07 – 2015-05-08 (×2): 1 via TOPICAL
  Administered 2015-05-08 – 2015-05-10 (×4): via TOPICAL
  Filled 2015-05-04 (×3): qty 15

## 2015-05-04 MED ORDER — ONDANSETRON HCL 4 MG/2ML IJ SOLN
4.0000 mg | Freq: Four times a day (QID) | INTRAMUSCULAR | Status: DC | PRN
  Administered 2015-05-05 – 2015-05-07 (×5): 4 mg via INTRAVENOUS
  Filled 2015-05-04 (×5): qty 2

## 2015-05-04 NOTE — Progress Notes (Signed)
Trauma Service Note  Subjective: Patient with minimal complaints and not using his PCA very much.  Objective: Vital signs in last 24 hours: Temp:  [99.4 F (37.4 C)-102.3 F (39.1 C)] 99.9 F (37.7 C) (03/07 0400) Pulse Rate:  [94-122] 107 (03/07 0700) Resp:  [13-23] 21 (03/07 0700) BP: (128-157)/(61-89) 150/75 mmHg (03/07 0700) SpO2:  [94 %-100 %] 94 % (03/07 0700)    Intake/Output from previous day: 03/06 0701 - 03/07 0700 In: 2878.3 [I.V.:2878.3] Out: 2160 [Urine:1960; Emesis/NG output:200] Intake/Output this shift:    General: No acute distress.  Lungs: Clear to auscultation.  Sats are okay.  Abd: Soft, good bowel sounds.  Mildly distended.  Midline wound is open and having some early granulation.  Extremities: Left thigh is swollen., good pulses.  Neuro: Intact  GU:  Foley coming out of the base of his penis.  Draining clear urine.  Scrotal incision is clean and dry.  Lab Results: CBC   Recent Labs  05/02/15 0841 05/04/15 0347  WBC 14.4* 7.5  HGB 9.7* 7.5*  HCT 28.8* 23.4*  PLT 167 130*   BMET  Recent Labs  05/02/15 0841 05/04/15 0347  NA 138 139  K 3.9 3.5  CL 106 101  CO2 22 28  GLUCOSE 193* 133*  BUN 9 <5*  CREATININE 0.94 0.83  CALCIUM 8.0* 8.3*   PT/INR  Recent Labs  05/02/15 0300  LABPROT 16.2*  INR 1.29   ABG  Recent Labs  05/02/15 0449  PHART 7.340*  HCO3 21.7    Studies/Results: No results found.  Anti-infectives: Anti-infectives    Start     Dose/Rate Route Frequency Ordered Stop   05/02/15 1000  cefoTEtan (CEFOTAN) 2 g in dextrose 5 % 50 mL IVPB     2 g 100 mL/hr over 30 Minutes Intravenous Every 12 hours 05/02/15 0738 05/02/15 2146   05/02/15 0330  [MAR Hold]  ceFAZolin (ANCEF) IVPB 1 g/50 mL premix  Status:  Discontinued     (MAR Hold since 05/02/15 0328)   1 g 100 mL/hr over 30 Minutes Intravenous  Once 05/02/15 0321 05/02/15 0738      Assessment/Plan: s/p Procedure(s): EXPLORATORY LAPAROTOMY, SMALL  BOWEL RESECTION, RIGHT COLOECTOMY, COLONORRHAPHY CYSTOSCOPY, SCROTAL EXPLORATION, CUTANEOUS URETHROSTOMY.. Continue foley due to penile urethral injury  Discontinue NGT and PCA Sips of clear liquids.  LOS: 2 days   Marta LamasJames O. Gae BonWyatt, III, MD, FACS [phone] Trauma Surgeon 05/04/2015
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
## 2015-05-04 NOTE — Progress Notes (Signed)
Patient ID: Joseph Atkinson, male   DOB: 08/04/1990, 25 y.o.   MRN: [id number]  Subjective: Patient reports minimal penoscrotal pain.  Objective: Vital signs in last 24 hours: Temp:  [99.4 F (37.4 C)-102.3 F (39.1 C)] 99.9 F (37.7 C) (03/07 0400) Pulse Rate:  [94-122] 94 (03/07 0600) Resp:  [13-23] 16 (03/07 0600) BP: (128-157)/(61-89) 131/68 mmHg (03/07 0600) SpO2:  [98 %-100 %] 100 % (03/07 0600)A  Intake/Output from previous day: 03/06 0701 - 03/07 0700 In: 2753.3 [I.V.:2753.3] Out: 1960 [Urine:1960] Intake/Output this shift:    History reviewed. No pertinent past medical history.  Physical Exam:  Lungs - Normal respiratory effort, chest expands symmetrically.  Penile incisions appear to be healing well.  He has no evidence of infection. Scrotal incision is healing with no sign of infection either. Foley catheter in place and draining clear urine.  Lab Results:  Recent Labs  05/02/15 0300 05/02/15 0449 05/02/15 0841 05/04/15 0347  WBC 11.2*  --  14.4* 7.5  HGB 11.5* 10.5* 9.7* 7.5*  HCT 35.7* 31.0* 28.8* 23.4*   BMET  Recent Labs  05/02/15 0841 05/04/15 0347  NA 138 139  K 3.9 3.5  CL 106 101  CO2 22 28  GLUCOSE 193* 133*  BUN 9 <5*  CREATININE 0.94 0.83  CALCIUM 8.0* 8.3*   No results for input(s): LABURIN in the last 72 hours. Results for orders placed or performed during the hospital encounter of 05/02/15  MRSA PCR Screening     Status: None   Collection Time: 05/02/15  9:01 AM  Result Value Ref Range Status   MRSA by PCR NEGATIVE NEGATIVE Final    Comment:        The GeneXpert MRSA Assay (FDA approved for NASAL specimens only), is one component of a comprehensive MRSA colonization surveillance program. It is not intended to diagnose MRSA infection nor to guide or monitor treatment for MRSA infections.     Studies/Results: No results found.  Assessment: Penoscrotal injuries - his incisions are healing with no sign of infection.   We discussed the need to leave the catheter upon discharge for about a week and then have it removed.  With his bandages removed local wound care will be undertaken with Neosporin and dry gauze dressings b.i.d.  Plan: 1.  Neosporin and gauze dressing b.i.d. 2.  Maintain Foley catheter.  Joseph Atkinson C 05/04/2015, 7:20 AM

## 2015-05-04 NOTE — Progress Notes (Signed)
Wasted 16 cc Dilaudid PCA in sink. Witnessed by K. Gordy LevanWalton, RN.

## 2015-05-05 LAB — BASIC METABOLIC PANEL
Anion gap: 6 (ref 5–15)
CALCIUM: 8.1 mg/dL — AB (ref 8.9–10.3)
CO2: 28 mmol/L (ref 22–32)
CREATININE: 0.9 mg/dL (ref 0.61–1.24)
Chloride: 104 mmol/L (ref 101–111)
GFR calc Af Amer: >60 mL/min (ref >60)
GFR calc non Af Amer: >60 mL/min (ref >60)
GLUCOSE: 121 mg/dL — AB (ref 65–99)
Potassium: 3.3 mmol/L — ABNORMAL LOW (ref 3.5–5.1)
SODIUM: 138 mmol/L (ref 135–145)

## 2015-05-05 LAB — CBC WITH DIFFERENTIAL/PLATELET
Basophils Absolute: 0 10*3/uL (ref 0.0–0.1)
Basophils Relative: 0 %
EOS ABS: 0.1 10*3/uL (ref 0.0–0.7)
EOS PCT: 1 %
HCT: 19.9 % — ABNORMAL LOW (ref 39.0–52.0)
Hemoglobin: 6.6 g/dL — CL (ref 13.0–17.0)
LYMPHS ABS: 1.6 10*3/uL (ref 0.7–4.0)
LYMPHS PCT: 23 %
MCH: 28.3 pg (ref 26.0–34.0)
MCHC: 33.2 g/dL (ref 30.0–36.0)
MCV: 85.4 fL (ref 78.0–100.0)
MONO ABS: 0.6 10*3/uL (ref 0.1–1.0)
MONOS PCT: 9 %
Neutro Abs: 4.7 10*3/uL (ref 1.7–7.7)
Neutrophils Relative %: 67 %
PLATELETS: 152 10*3/uL (ref 150–400)
RBC: 2.33 MIL/uL — AB (ref 4.22–5.81)
RDW: 13.8 % (ref 11.5–15.5)
WBC: 7 10*3/uL (ref 4.0–10.5)

## 2015-05-05 LAB — PREPARE RBC (CROSSMATCH)

## 2015-05-05 NOTE — Progress Notes (Signed)
CRITICAL VALUE ALERT  Critical value received: Hemoglobin 6.6  Date of notification:  05/05/15  Time of notification:  0351  Critical value read back:Yes.    Nurse who received alert:  Kandis BanL Kiser RN  MD notified (1st page):  M.Tsuei MD  Time of first page: 0351  MD notified (2nd page):  Time of second page:  Responding MD:  M.Tsuei  Time MD responded:  0352  No new orders received at this time, pt remains asymptomatic. . Will continue to monitor. Dicie BeamFrazier, [place]chum Fenter RN BSN.

## 2015-05-05 NOTE — Progress Notes (Signed)
Trauma Service Note  Subjective: Patient states that he is doing okay.  No acute distress  Objective: Vital signs in last 24 hours: Temp:  [98.6 F (37 C)-101.3 F (38.5 C)] 100 F (37.8 C) (03/08 0400) Pulse Rate:  [99-120] 100 (03/08 0821) Resp:  [13-19] 19 (03/08 0821) BP: (134-153)/(68-90) 138/72 mmHg (03/08 0800) SpO2:  [89 %-99 %] 96 % (03/08 0821)    Intake/Output from previous day: 03/07 0701 - 03/08 0700 In: 1829.3 [I.V.:1829.3] Out: 1095 [Urine:1095] Intake/Output this shift: Total I/O In: 700 [I.V.:700] Out: 275 [Urine:275]  General: No acute distress.  Says that he has had no BM or flatus  Lungs: Clear to auscultation  Abd: Distended mildly , hypoactive bowel sounds.  Extremities: No clinical signs or symptoms of DVT.  Left thigh hematoma  Neuro: Intact  Lab Results: CBC   Recent Labs  05/04/15 0347 05/05/15 0300  WBC 7.5 7.0  HGB 7.5* 6.6*  HCT 23.4* 19.9*  PLT 130* 152   BMET  Recent Labs  05/04/15 0347 05/05/15 0300  NA 139 138  K 3.5 3.3*  CL 101 104  CO2 28 28  GLUCOSE 133* 121*  BUN <5* <5*  CREATININE 0.83 0.90  CALCIUM 8.3* 8.1*   PT/INR No results for input(s): LABPROT, INR in the last 72 hours. ABG No results for input(s): PHART, HCO3 in the last 72 hours.  Invalid input(s): PCO2, PO2  Studies/Results: No results found.  Anti-infectives: Anti-infectives    Start     Dose/Rate Route Frequency Ordered Stop   05/02/15 1000  cefoTEtan (CEFOTAN) 2 g in dextrose 5 % 50 mL IVPB     2 g 100 mL/hr over 30 Minutes Intravenous Every 12 hours 05/02/15 0738 05/02/15 2146   05/02/15 0330  [MAR Hold]  ceFAZolin (ANCEF) IVPB 1 g/50 mL premix  Status:  Discontinued     (MAR Hold since 05/02/15 0328)   1 g 100 mL/hr over 30 Minutes Intravenous  Once 05/02/15 0321 05/02/15 0738      Assessment/Plan: s/p Procedure(s): EXPLORATORY LAPAROTOMY, SMALL BOWEL RESECTION, RIGHT COLOECTOMY, COLONORRHAPHY CYSTOSCOPY, SCROTAL EXPLORATION,  CUTANEOUS URETHROSTOMY.. Transfer when bed available.  Transfuse one unit of blood for hemoglobin of 6.6   LOS: 3 days   Marta LamasJames O. Gae BonWyatt, III, MD, FACS [phone] Trauma Surgeon 05/05/2015

## 2015-05-05 NOTE — Progress Notes (Signed)
PT Cancellation Note  Patient Details Name: Lucille PassyMichael XXXPierce MRN: [id number] DOB: February 03, 1991   Cancelled Treatment:    Reason Eval/Treat Not Completed: Pain limiting ability to participate.  RN requests to hold PT for now as pt is restless due to 10/10 pain.  PT will continue to follow acutely.  Encarnacion ChuAshley Edahi Kroening PT, DPT  Pager: [phone] Phone: [phone] 05/05/2015, 2:18 PM

## 2015-05-06 LAB — TYPE AND SCREEN
ABO/RH(D): A POS
Antibody Screen: NEGATIVE
UNIT DIVISION: 0
UNIT DIVISION: 0
Unit division: 0

## 2015-05-06 LAB — CBC WITH DIFFERENTIAL/PLATELET
BASOS ABS: 0 10*3/uL (ref 0.0–0.1)
Basophils Relative: 0 %
EOS ABS: 0.1 10*3/uL (ref 0.0–0.7)
Eosinophils Relative: 1 %
HCT: 24.7 % — ABNORMAL LOW (ref 39.0–52.0)
Hemoglobin: 8.5 g/dL — ABNORMAL LOW (ref 13.0–17.0)
LYMPHS ABS: 1.4 10*3/uL (ref 0.7–4.0)
LYMPHS PCT: 13 %
MCH: 28.7 pg (ref 26.0–34.0)
MCHC: 34.4 g/dL (ref 30.0–36.0)
MCV: 83.4 fL (ref 78.0–100.0)
MONO ABS: 0.9 10*3/uL (ref 0.1–1.0)
Monocytes Relative: 9 %
NEUTROS ABS: 7.9 10*3/uL — AB (ref 1.7–7.7)
Neutrophils Relative %: 77 %
PLATELETS: 234 10*3/uL (ref 150–400)
RBC: 2.96 MIL/uL — AB (ref 4.22–5.81)
RDW: 13.6 % (ref 11.5–15.5)
WBC: 10.2 10*3/uL (ref 4.0–10.5)

## 2015-05-06 MED ORDER — OXYCODONE HCL 5 MG PO TABS
5.0000 mg | ORAL_TABLET | ORAL | Status: DC | PRN
  Administered 2015-05-06 – 2015-05-07 (×6): 15 mg via ORAL
  Filled 2015-05-06 (×7): qty 3

## 2015-05-06 MED ORDER — FENTANYL CITRATE (PF) 100 MCG/2ML IJ SOLN
50.0000 ug | INTRAMUSCULAR | Status: DC | PRN
Start: 2015-05-06 — End: 2015-05-07
  Administered 2015-05-06 – 2015-05-07 (×10): 100 ug via INTRAVENOUS
  Filled 2015-05-06 (×11): qty 2
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
## 2015-05-06 NOTE — Progress Notes (Signed)
PT Cancellation Note  Patient Details Name: Joseph Atkinson MRN: [id number] DOB: 05/20/1990   Cancelled Treatment:    Reason Eval/Treat Not Completed: Other (comment) (pt recently amb in the halls.)   [facility] LLCMAYCOCK,Cebert Dettmann 05/06/2015, 4:02 PM [facility] Loyd Salvador PT [phone]

## 2015-05-07 DIAGNOSIS — S36409A Unspecified injury of unspecified part of small intestine, initial encounter: Secondary | ICD-10-CM | POA: Diagnosis present

## 2015-05-07 DIAGNOSIS — S3994XA Unspecified injury of external genitals, initial encounter: Secondary | ICD-10-CM | POA: Diagnosis present

## 2015-05-07 DIAGNOSIS — S36509A Unspecified injury of unspecified part of colon, initial encounter: Secondary | ICD-10-CM | POA: Diagnosis present

## 2015-05-07 DIAGNOSIS — D62 Acute posthemorrhagic anemia: Secondary | ICD-10-CM | POA: Diagnosis not present

## 2015-05-07 LAB — CBC
HCT: 23.7 % — ABNORMAL LOW (ref 39.0–52.0)
Hemoglobin: 7.7 g/dL — ABNORMAL LOW (ref 13.0–17.0)
MCH: 27.4 pg (ref 26.0–34.0)
MCHC: 32.5 g/dL (ref 30.0–36.0)
MCV: 84.3 fL (ref 78.0–100.0)
PLATELETS: 259 10*3/uL (ref 150–400)
RBC: 2.81 MIL/uL — AB (ref 4.22–5.81)
RDW: 13.5 % (ref 11.5–15.5)
WBC: 7.8 10*3/uL (ref 4.0–10.5)

## 2015-05-07 MED ORDER — OXYCODONE HCL 5 MG PO TABS
10.0000 mg | ORAL_TABLET | ORAL | Status: DC | PRN
  Administered 2015-05-07 – 2015-05-10 (×8): 20 mg via ORAL
  Filled 2015-05-07 (×9): qty 4

## 2015-05-07 MED ORDER — NAPROXEN 250 MG PO TABS
500.0000 mg | ORAL_TABLET | Freq: Two times a day (BID) | ORAL | Status: DC
  Administered 2015-05-07 – 2015-05-10 (×6): 500 mg via ORAL
  Filled 2015-05-07 (×7): qty 2

## 2015-05-07 MED ORDER — FENTANYL CITRATE (PF) 100 MCG/2ML IJ SOLN
50.0000 ug | INTRAMUSCULAR | Status: DC | PRN
  Administered 2015-05-07 – 2015-05-09 (×7): 50 ug via INTRAVENOUS
  Filled 2015-05-07 (×7): qty 2

## 2015-05-07 MED ORDER — TRAMADOL HCL 50 MG PO TABS
100.0000 mg | ORAL_TABLET | Freq: Four times a day (QID) | ORAL | Status: DC
  Administered 2015-05-07 – 2015-05-10 (×13): 100 mg via ORAL
  Filled 2015-05-07 (×13): qty 2

## 2015-05-07 MED ORDER — ENOXAPARIN SODIUM 30 MG/0.3ML ~~LOC~~ SOLN
30.0000 mg | Freq: Two times a day (BID) | SUBCUTANEOUS | Status: DC
  Administered 2015-05-07 – 2015-05-10 (×7): 30 mg via SUBCUTANEOUS
  Filled 2015-05-07 (×7): qty 0.3
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
## 2015-05-08 LAB — CBC
HEMATOCRIT: 22.5 % — AB (ref 39.0–52.0)
Hemoglobin: 7.5 g/dL — ABNORMAL LOW (ref 13.0–17.0)
MCH: 28.3 pg (ref 26.0–34.0)
MCHC: 33.3 g/dL (ref 30.0–36.0)
MCV: 84.9 fL (ref 78.0–100.0)
PLATELETS: 284 10*3/uL (ref 150–400)
RBC: 2.65 MIL/uL — ABNORMAL LOW (ref 4.22–5.81)
RDW: 13.9 % (ref 11.5–15.5)
WBC: 6.5 10*3/uL (ref 4.0–10.5)

## 2015-05-08 LAB — CULTURE, BLOOD (ROUTINE X 2)
CULTURE: NO GROWTH
CULTURE: NO GROWTH
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
## 2015-05-09 MED ORDER — POLYETHYLENE GLYCOL 3350 17 G PO PACK
17.0000 g | PACK | Freq: Every day | ORAL | Status: DC
  Administered 2015-05-09 – 2015-05-10 (×2): 17 g via ORAL
  Filled 2015-05-09 (×2): qty 1

## 2015-05-09 MED ORDER — DOCUSATE SODIUM 100 MG PO CAPS
100.0000 mg | ORAL_CAPSULE | Freq: Two times a day (BID) | ORAL | Status: DC
  Administered 2015-05-09 – 2015-05-10 (×3): 100 mg via ORAL
  Filled 2015-05-09 (×3): qty 1
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
## 2015-05-09 NOTE — Progress Notes (Signed)
Pt ambulated in hallway while using walker with friends beside him.  Pt walked entire unit, about 3640ft, once.  Pt tolerated walk.  Denied any shortness of breath or dizziness or weakness.  Will continue to encourage ambulation.

## 2015-05-10 ENCOUNTER — Encounter (HOSPITAL_COMMUNITY): Payer: Self-pay | Admitting: Emergency Medicine

## 2015-05-10 MED ORDER — OXYCODONE-ACETAMINOPHEN 10-325 MG PO TABS: 1.0000 | ORAL_TABLET | ORAL | Status: DC | PRN

## 2015-05-10 MED ORDER — NAPROXEN 500 MG PO TABS: 500.0000 mg | ORAL_TABLET | Freq: Two times a day (BID) | ORAL | Status: DC

## 2015-05-10 MED ORDER — TRAMADOL HCL 50 MG PO TABS: 100.0000 mg | ORAL_TABLET | Freq: Four times a day (QID) | ORAL | Status: DC

## 2015-05-10 NOTE — Progress Notes (Signed)
Telephone order from Charma IgoMichael Jeffery, PA to discharge to home
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
## 2015-05-10 NOTE — Discharge Instructions (Signed)
Wash wounds daily in shower with soap and water. Do not soak. Apply antibiotic ointment (e.g. Neosporin) twice daily and as needed to keep moist. Cover with dry dressing.  Apply wet-to-dry dressing to abdominal wound twice daily. It's ok to shower with the wound open and wash it with soap and water.  No lifting more than 10 pounds for 6 weeks.  No driving while taking oxycodone.

## 2015-05-10 NOTE — Care Management Note (Signed)
Case Management Note  Patient Details  Name: Joseph Atkinson MRN: [id number] Date of Birth: 05-28-1990  Subjective/Objective:  Pt for dc home today [facility]'s [facility].  Pt listed as having Medicaid, but after checking in system, this is not active.  Pt will have assistance from mother and girlfriend, Joseph Atkinson, at bedside.                    Action/Plan: [facility].  [facility].  Pt [facility]; [facility] given with explanation of program benefits.    Expected Discharge Date:   05/10/15               Expected Discharge Plan:  Home w Home Health Services  In-House Referral:  Clinical Social Work  Discharge planning Services  CM Consult; medication assistance, MATCH program  Post Acute Care Choice:  Home Health Choice offered to:  Patient  DME Arranged:  3-N-1, Walker rolling DME Agency:  [facility]  HH Arranged:  RN HH Agency:  [facility]  Status of Service:  Completed, signed off  Medicare Important Message Given:    Date Medicare IM Given:    Medicare IM give by:    Date Additional Medicare IM Given:    Additional Medicare Important Message give by:     If discussed at Long Length of Stay Meetings, dates discussed:    Additional Comments:  Quintella BatonJulie W. Karel Mowers, RN, BSN  Trauma/Neuro ICU Case Manager [phone]

## 2015-05-10 NOTE — Progress Notes (Signed)
Lucille PassyMichael XXXPierce to be D/C'd Home per MD order.  Discussed with the patient and all questions fully answered.  VSS. IV catheter discontinued intact. Site without signs and symptoms of complications. Dressing and pressure applied.  An After Visit Summary was printed and given to the patient. Patient received prescription.  D/c education completed with patient/family including follow up instructions, medication list, d/c activities limitations if indicated, with other d/c instructions as indicated by MD - patient able to verbalize understanding, all questions fully answered.   Patient instructed to return to ED, call 911, or call MD for any changes in condition.   Patient to be escorted via WC, and D/C home via private auto.  L'ESPERANCE, Bitha Fauteux C 05/10/2015 1:03 PM
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
## 2015-07-04 ENCOUNTER — Emergency Department (HOSPITAL_COMMUNITY)
Admission: EM | Admit: 2015-07-04 | Discharge: 2015-07-05 | Disposition: A | Discharge status: Home / Self Care | Payer: Medicaid Other | Attending: Emergency Medicine | Admitting: Emergency Medicine

## 2015-07-04 ENCOUNTER — Encounter (HOSPITAL_COMMUNITY): Payer: Self-pay | Admitting: *Deleted

## 2015-07-04 DIAGNOSIS — R05 Cough: Secondary | ICD-10-CM | POA: Insufficient documentation

## 2015-07-04 DIAGNOSIS — F319 Bipolar disorder, unspecified: Secondary | ICD-10-CM | POA: Insufficient documentation

## 2015-07-04 DIAGNOSIS — F209 Schizophrenia, unspecified: Secondary | ICD-10-CM | POA: Insufficient documentation

## 2015-07-04 DIAGNOSIS — Z791 Long term (current) use of non-steroidal anti-inflammatories (NSAID): Secondary | ICD-10-CM | POA: Insufficient documentation

## 2015-07-04 DIAGNOSIS — R509 Fever, unspecified: Secondary | ICD-10-CM | POA: Insufficient documentation

## 2015-07-04 DIAGNOSIS — R059 Cough, unspecified: Secondary | ICD-10-CM

## 2015-07-04 DIAGNOSIS — M79642 Pain in left hand: Secondary | ICD-10-CM | POA: Insufficient documentation

## 2015-07-04 DIAGNOSIS — Z87828 Personal history of other (healed) physical injury and trauma: Secondary | ICD-10-CM | POA: Insufficient documentation

## 2015-07-04 DIAGNOSIS — F17210 Nicotine dependence, cigarettes, uncomplicated: Secondary | ICD-10-CM | POA: Insufficient documentation

## 2015-07-04 DIAGNOSIS — R202 Paresthesia of skin: Secondary | ICD-10-CM

## 2015-07-04 DIAGNOSIS — Z79899 Other long term (current) drug therapy: Secondary | ICD-10-CM | POA: Insufficient documentation

## 2015-07-04 DIAGNOSIS — R109 Unspecified abdominal pain: Secondary | ICD-10-CM | POA: Insufficient documentation

## 2015-07-04 NOTE — ED Notes (Signed)
The pt is c/o abd pain cold cough throat hurting back pain weakness headache breathing hard at times for 2 weeks.  Had his phone on while being triaged with someone listening  [facility]  He did  No distress

## 2015-07-05 ENCOUNTER — Emergency Department (HOSPITAL_COMMUNITY): Payer: Medicaid Other

## 2015-07-05 MED ORDER — BENZONATATE 100 MG PO CAPS
100.0000 mg | ORAL_CAPSULE | Freq: Once | ORAL | Status: AC
  Administered 2015-07-05: 100 mg via ORAL
  Filled 2015-07-05: qty 1

## 2015-07-05 MED ORDER — BENZONATATE 100 MG PO CAPS: 100.0000 mg | ORAL_CAPSULE | Freq: Three times a day (TID) | ORAL | Status: DC

## 2015-07-05 NOTE — Discharge Instructions (Signed)
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
## 2016-01-12 ENCOUNTER — Encounter (HOSPITAL_COMMUNITY): Payer: Self-pay | Admitting: *Deleted

## 2016-01-12 ENCOUNTER — Emergency Department (HOSPITAL_COMMUNITY)
Admission: EM | Admit: 2016-01-12 | Discharge: 2016-01-12 | Disposition: A | Discharge status: Home / Self Care | Payer: Medicaid Other | Attending: Emergency Medicine | Admitting: Emergency Medicine

## 2016-01-12 DIAGNOSIS — M654 Radial styloid tenosynovitis [de Quervain]: Secondary | ICD-10-CM | POA: Insufficient documentation

## 2016-01-12 DIAGNOSIS — F909 Attention-deficit hyperactivity disorder, unspecified type: Secondary | ICD-10-CM | POA: Insufficient documentation

## 2016-01-12 DIAGNOSIS — F17210 Nicotine dependence, cigarettes, uncomplicated: Secondary | ICD-10-CM | POA: Insufficient documentation

## 2016-01-12 MED ORDER — MELOXICAM 15 MG PO TABS: 15.0000 mg | ORAL_TABLET | Freq: Every day | ORAL | 0 refills | Status: DC

## 2016-01-12 NOTE — ED Notes (Signed)
Declined W/C at D/C and was escorted to lobby by RN. 

## 2016-01-12 NOTE — Progress Notes (Signed)
Orthopedic Tech Progress Note Patient Details:  Joseph BogaMichael Atkinson [birth year] [id number]  Ortho Devices Type of Ortho Device: Thumb velcro splint Ortho Device/Splint Location: rue Ortho Device/Splint Interventions: Application   [name] 01/12/2016, 10:39 AM
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
## 2016-01-12 NOTE — ED Triage Notes (Signed)
Pt reports pain to right hand for extended amount of time, denies injury.

## 2016-01-12 NOTE — Discharge Instructions (Signed)
Wear your brace at all times except when bathing. Ice the area 3 times a day. You may purchase 4% lidocaine patches and apply them over the area that hurts. These are very helpful with pain control, but you will still need to rest and ice the thumb. If you are not improving with rest and ice along with the medication I have prescribed, make an appointment with Dr. Janee Mornhompson.

## 2016-06-23 IMAGING — DX DG CHEST 2V
2 series · 2 of 2 positions shown · non-contrast
Comparison: Chest radiograph dated 10/16/2004

CLINICAL DATA: 24-year-old male with shortness of breath and cough

EXAM:
CHEST  2 VIEW

[chest pa]
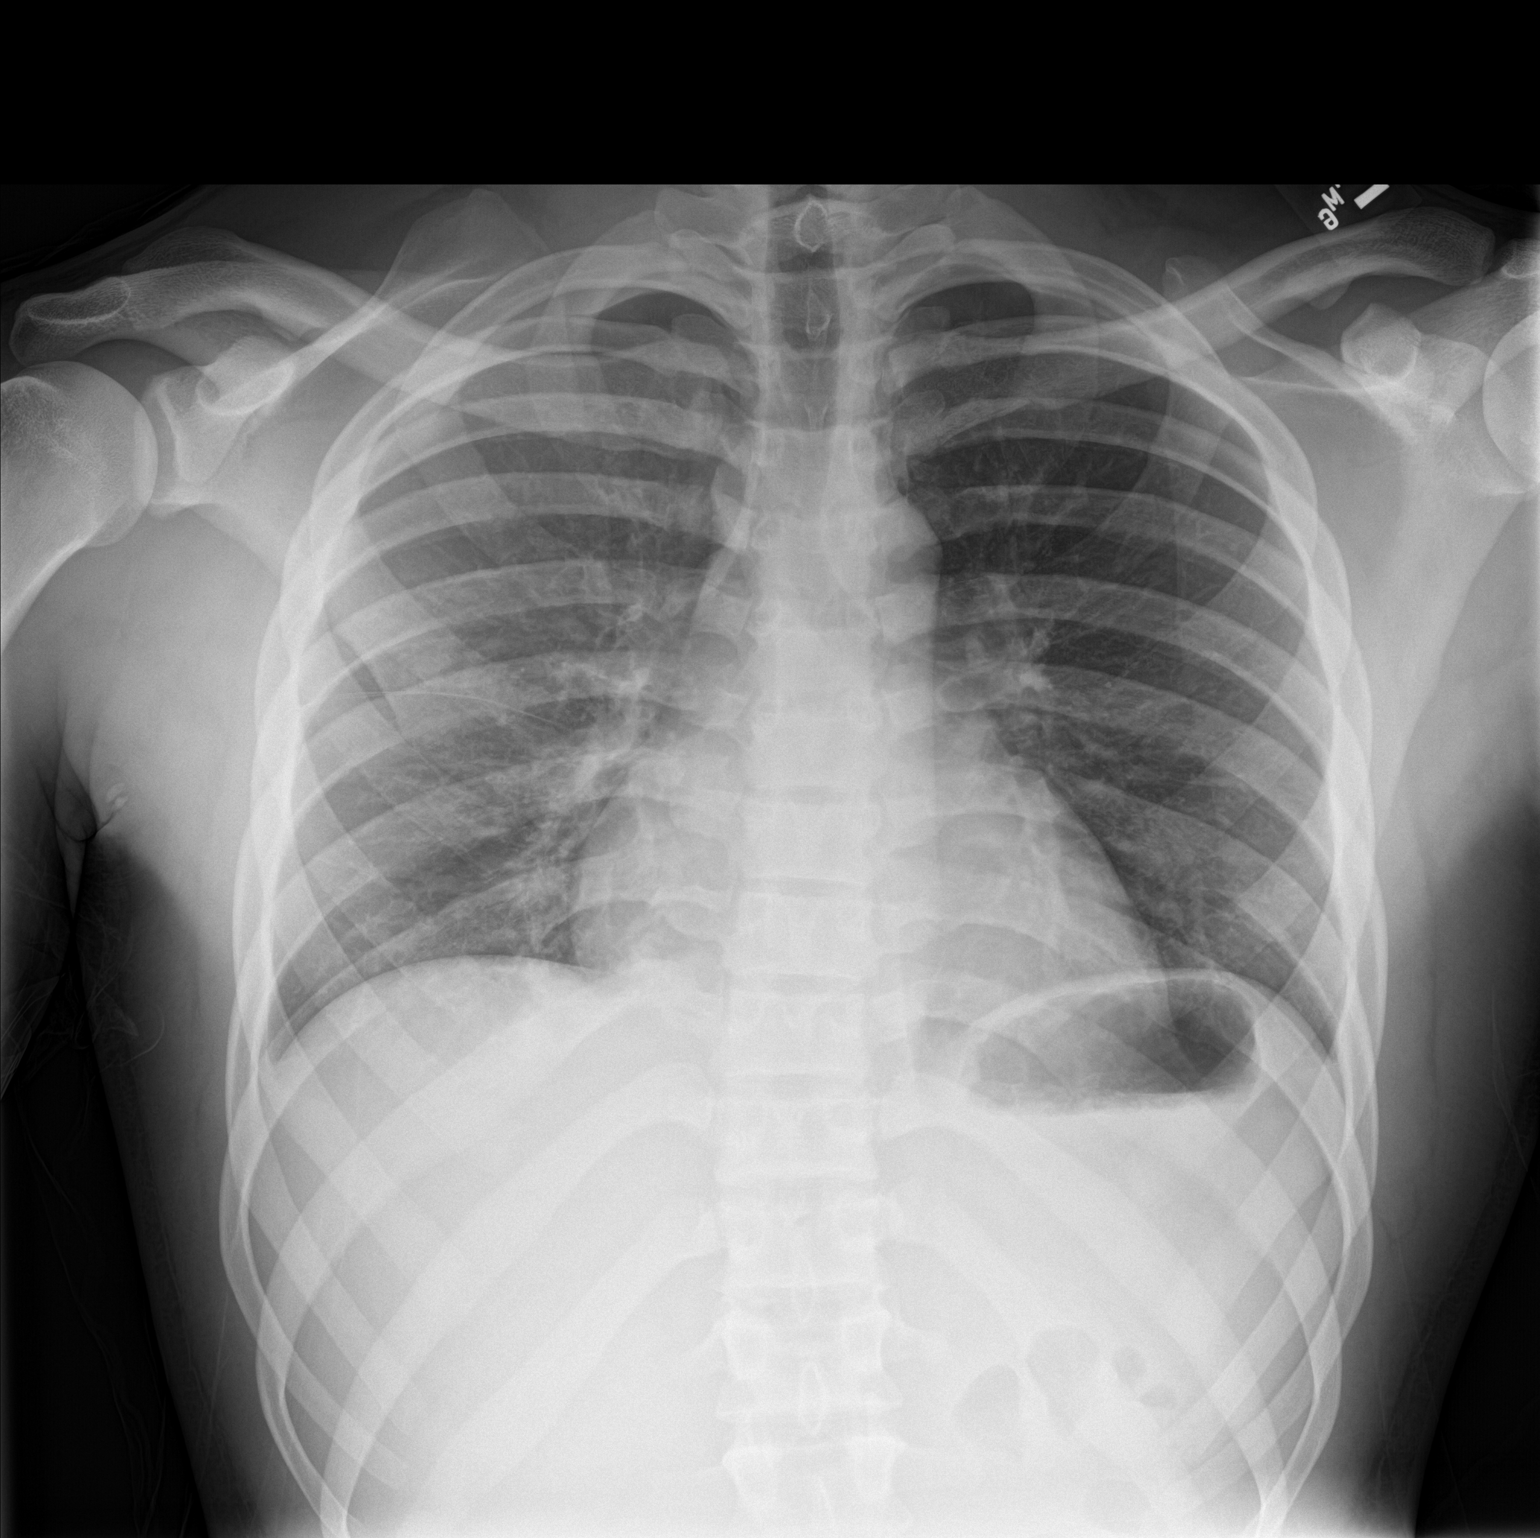

[chest lat]
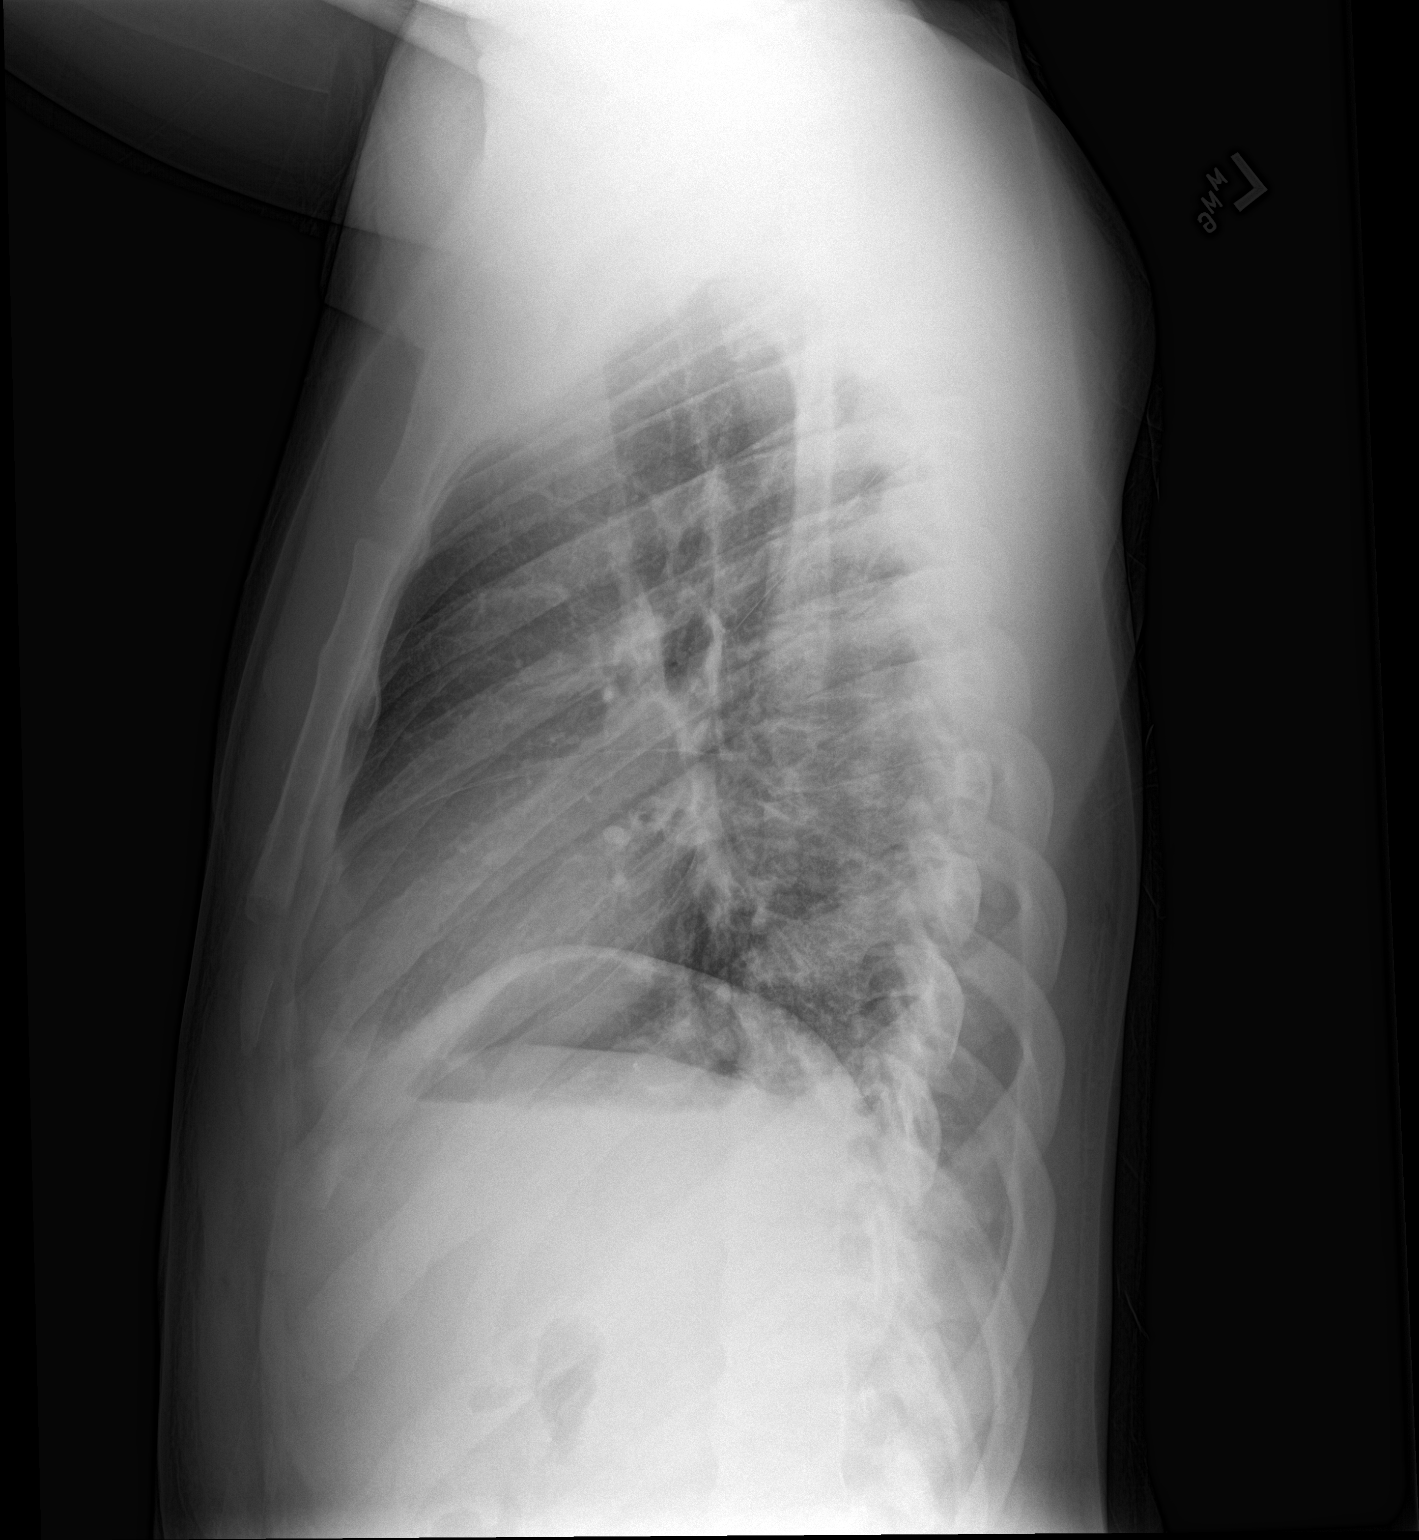

[2 of 2 positions shown; findings below may reference images not displayed]

FINDINGS: The heart size and mediastinal contours are within normal limits.
Both lungs are clear. The visualized skeletal structures are
unremarkable.
IMPRESSION: No active cardiopulmonary disease.

## 2016-06-26 ENCOUNTER — Encounter (HOSPITAL_COMMUNITY): Payer: Self-pay

## 2016-06-26 ENCOUNTER — Emergency Department (HOSPITAL_COMMUNITY)
Admission: EM | Admit: 2016-06-26 | Discharge: 2016-06-26 | Disposition: A | Discharge status: Home / Self Care | Payer: Self-pay | Attending: Emergency Medicine | Admitting: Emergency Medicine

## 2016-06-26 ENCOUNTER — Emergency Department (HOSPITAL_COMMUNITY): Payer: Self-pay

## 2016-06-26 DIAGNOSIS — Z79899 Other long term (current) drug therapy: Secondary | ICD-10-CM | POA: Insufficient documentation

## 2016-06-26 DIAGNOSIS — Y999 Unspecified external cause status: Secondary | ICD-10-CM | POA: Insufficient documentation

## 2016-06-26 DIAGNOSIS — Y939 Activity, unspecified: Secondary | ICD-10-CM | POA: Insufficient documentation

## 2016-06-26 DIAGNOSIS — F17210 Nicotine dependence, cigarettes, uncomplicated: Secondary | ICD-10-CM | POA: Insufficient documentation

## 2016-06-26 DIAGNOSIS — S46911A Strain of unspecified muscle, fascia and tendon at shoulder and upper arm level, right arm, initial encounter: Secondary | ICD-10-CM

## 2016-06-26 DIAGNOSIS — S46011A Strain of muscle(s) and tendon(s) of the rotator cuff of right shoulder, initial encounter: Secondary | ICD-10-CM | POA: Insufficient documentation

## 2016-06-26 DIAGNOSIS — S43421A Sprain of right rotator cuff capsule, initial encounter: Secondary | ICD-10-CM

## 2016-06-26 DIAGNOSIS — X500XXA Overexertion from strenuous movement or load, initial encounter: Secondary | ICD-10-CM | POA: Insufficient documentation

## 2016-06-26 DIAGNOSIS — Y929 Unspecified place or not applicable: Secondary | ICD-10-CM | POA: Insufficient documentation

## 2016-06-26 DIAGNOSIS — F909 Attention-deficit hyperactivity disorder, unspecified type: Secondary | ICD-10-CM | POA: Insufficient documentation

## 2016-06-26 MED ORDER — PREDNISONE 20 MG PO TABS: 60.0000 mg | ORAL_TABLET | Freq: Once | ORAL | Status: DC

## 2016-06-26 MED ORDER — METHOCARBAMOL 500 MG PO TABS: 500.0000 mg | ORAL_TABLET | Freq: Three times a day (TID) | ORAL | 0 refills | Status: DC | PRN

## 2016-06-26 MED ORDER — IBUPROFEN 600 MG PO TABS: 600.0000 mg | ORAL_TABLET | Freq: Four times a day (QID) | ORAL | 0 refills | Status: DC | PRN

## 2016-06-26 NOTE — Progress Notes (Signed)
Orthopedic Tech Progress Note Patient Details:  Joseph Atkinson [birth year] [id number]  Ortho Devices Type of Ortho Device: Arm sling Ortho Device/Splint Location: applied arm sling on pt right arm/shoulder.  pt tolerated well.  right arm. Ortho Device/Splint Interventions: Application, Adjustment   Alvina Chou 06/26/2016, 2:57 PM

## 2016-06-26 NOTE — ED Triage Notes (Signed)
Per Pt, Pt woke up with pain to the right shoulder when he woke up. Reports it feels like it is out of place. Pt has good pulses and can move hand, but has pain with moving shoulder.

## 2016-06-26 NOTE — ED Notes (Signed)
Ortho Paged.  
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
## 2016-06-26 NOTE — ED Notes (Signed)
Pt ambulated to room from waiting area. Pt had no complaints while ambulating.  

## 2018-04-05 ENCOUNTER — Emergency Department (HOSPITAL_COMMUNITY)
Admission: EM | Admit: 2018-04-05 | Discharge: 2018-04-05 | Disposition: A | Discharge status: Home / Self Care | Payer: Self-pay | Attending: Emergency Medicine | Admitting: Emergency Medicine

## 2018-04-05 ENCOUNTER — Encounter (HOSPITAL_COMMUNITY): Payer: Self-pay | Admitting: Emergency Medicine

## 2018-04-05 DIAGNOSIS — Z79899 Other long term (current) drug therapy: Secondary | ICD-10-CM | POA: Insufficient documentation

## 2018-04-05 DIAGNOSIS — R69 Illness, unspecified: Secondary | ICD-10-CM

## 2018-04-05 DIAGNOSIS — J111 Influenza due to unidentified influenza virus with other respiratory manifestations: Secondary | ICD-10-CM | POA: Insufficient documentation

## 2018-04-05 DIAGNOSIS — F17210 Nicotine dependence, cigarettes, uncomplicated: Secondary | ICD-10-CM | POA: Insufficient documentation

## 2018-04-05 MED ORDER — ONDANSETRON 4 MG PO TBDP: 4.0000 mg | ORAL_TABLET | Freq: Three times a day (TID) | ORAL | 0 refills | Status: DC | PRN

## 2018-04-05 MED ORDER — FLUTICASONE PROPIONATE 50 MCG/ACT NA SUSP: 1.0000 | Freq: Every day | NASAL | 2 refills | Status: DC

## 2018-04-05 MED ORDER — OSELTAMIVIR PHOSPHATE 75 MG PO CAPS: 75.0000 mg | ORAL_CAPSULE | Freq: Two times a day (BID) | ORAL | 0 refills | Status: DC

## 2018-04-05 MED ORDER — ACETAMINOPHEN 325 MG PO TABS
650.0000 mg | ORAL_TABLET | Freq: Once | ORAL | Status: AC
  Administered 2018-04-05: 650 mg via ORAL
  Filled 2018-04-05: qty 2

## 2018-04-05 MED ORDER — BENZONATATE 100 MG PO CAPS: 200.0000 mg | ORAL_CAPSULE | Freq: Three times a day (TID) | ORAL | 0 refills | Status: DC

## 2018-04-05 MED ORDER — ACETAMINOPHEN 325 MG PO TABS: 650.0000 mg | ORAL_TABLET | Freq: Four times a day (QID) | ORAL | 0 refills | Status: AC | PRN

## 2018-04-05 NOTE — Discharge Instructions (Signed)
You most likely have the flu or a flu like illness. Since you are in the window of treatment with Tamiflu, I will provide a prescription of this. I will also include other symptomatic medications to help.  You may still feel bad for several days but you should take the medications every day. Return to the ED if you have worsening symptoms, coughing up blood, shortness of breath, increased vomiting.

## 2018-04-05 NOTE — ED Triage Notes (Signed)
Pt presents with fever and body aches since yesterday. Took dayquil and nyquil with no relief. No medical issues.
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
## 2018-04-09 ENCOUNTER — Encounter (HOSPITAL_COMMUNITY): Payer: Self-pay | Admitting: Emergency Medicine

## 2018-04-09 ENCOUNTER — Emergency Department (HOSPITAL_COMMUNITY)
Admission: EM | Admit: 2018-04-09 | Discharge: 2018-04-09 | Disposition: A | Discharge status: Home / Self Care | Payer: Self-pay | Attending: Emergency Medicine | Admitting: Emergency Medicine

## 2018-04-09 DIAGNOSIS — Z5321 Procedure and treatment not carried out due to patient leaving prior to being seen by health care provider: Secondary | ICD-10-CM | POA: Insufficient documentation

## 2018-04-09 DIAGNOSIS — R52 Pain, unspecified: Secondary | ICD-10-CM | POA: Insufficient documentation

## 2018-04-09 DIAGNOSIS — R509 Fever, unspecified: Secondary | ICD-10-CM | POA: Insufficient documentation

## 2018-04-09 MED ORDER — ACETAMINOPHEN 500 MG PO TABS
1000.0000 mg | ORAL_TABLET | Freq: Once | ORAL | Status: AC
  Administered 2018-04-09: 1000 mg via ORAL

## 2018-04-09 NOTE — ED Triage Notes (Signed)
Pt was diagnosed with flu on 2/7- pt still having fevers and chills.

## 2018-07-28 ENCOUNTER — Emergency Department (HOSPITAL_COMMUNITY)
Admission: EM | Admit: 2018-07-28 | Discharge: 2018-07-28 | Disposition: A | Discharge status: Home / Self Care | Payer: Self-pay | Attending: Emergency Medicine | Admitting: Emergency Medicine

## 2018-07-28 ENCOUNTER — Encounter (HOSPITAL_COMMUNITY): Payer: Self-pay | Admitting: Emergency Medicine

## 2018-07-28 ENCOUNTER — Other Ambulatory Visit: Payer: Self-pay

## 2018-07-28 DIAGNOSIS — Z5321 Procedure and treatment not carried out due to patient leaving prior to being seen by health care provider: Secondary | ICD-10-CM | POA: Insufficient documentation

## 2018-07-28 DIAGNOSIS — Y9389 Activity, other specified: Secondary | ICD-10-CM | POA: Insufficient documentation

## 2018-07-28 DIAGNOSIS — Y999 Unspecified external cause status: Secondary | ICD-10-CM | POA: Insufficient documentation

## 2018-07-28 DIAGNOSIS — Y929 Unspecified place or not applicable: Secondary | ICD-10-CM | POA: Insufficient documentation

## 2018-07-28 DIAGNOSIS — W269XXA Contact with unspecified sharp object(s), initial encounter: Secondary | ICD-10-CM | POA: Insufficient documentation

## 2018-07-28 DIAGNOSIS — S41111A Laceration without foreign body of right upper arm, initial encounter: Secondary | ICD-10-CM | POA: Insufficient documentation

## 2018-07-28 NOTE — ED Notes (Signed)
Patient left due to he was mad pregnant girlfriend could not come and that he was told he can not keep walking out to the parking lot and back. He took his blood pressure cuff off and threw it.

## 2018-07-28 NOTE — ED Notes (Signed)
Bed: WA23 Expected date:  Expected time:  Means of arrival:  Comments: 
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
## 2019-07-18 ENCOUNTER — Encounter (HOSPITAL_COMMUNITY): Payer: Self-pay

## 2019-07-18 ENCOUNTER — Emergency Department (HOSPITAL_COMMUNITY)
Admission: EM | Admit: 2019-07-18 | Discharge: 2019-07-18 | Disposition: A | Discharge status: Home / Self Care | Payer: Self-pay | Attending: Emergency Medicine | Admitting: Emergency Medicine

## 2019-07-18 ENCOUNTER — Other Ambulatory Visit: Payer: Self-pay

## 2019-07-18 DIAGNOSIS — Z5321 Procedure and treatment not carried out due to patient leaving prior to being seen by health care provider: Secondary | ICD-10-CM | POA: Insufficient documentation

## 2019-07-18 DIAGNOSIS — K0889 Other specified disorders of teeth and supporting structures: Secondary | ICD-10-CM | POA: Insufficient documentation

## 2019-07-18 NOTE — ED Triage Notes (Signed)
Pt reports L upper dental pain. States that one of his molars is chipped. Able to speak in complete sentences. No swelling noted.

## 2019-10-17 ENCOUNTER — Other Ambulatory Visit: Payer: Self-pay

## 2021-07-29 ENCOUNTER — Other Ambulatory Visit: Payer: Self-pay

## 2021-07-29 ENCOUNTER — Emergency Department (HOSPITAL_COMMUNITY)
Admission: EM | Admit: 2021-07-29 | Discharge: 2021-07-29 | Disposition: A | Discharge status: Home / Self Care | Payer: Self-pay | Attending: Emergency Medicine | Admitting: Emergency Medicine

## 2021-07-29 ENCOUNTER — Encounter (HOSPITAL_COMMUNITY): Payer: Self-pay

## 2021-07-29 DIAGNOSIS — M791 Myalgia, unspecified site: Secondary | ICD-10-CM | POA: Insufficient documentation

## 2021-07-29 DIAGNOSIS — Z20822 Contact with and (suspected) exposure to covid-19: Secondary | ICD-10-CM | POA: Insufficient documentation

## 2021-07-29 DIAGNOSIS — Z5321 Procedure and treatment not carried out due to patient leaving prior to being seen by health care provider: Secondary | ICD-10-CM | POA: Insufficient documentation

## 2021-07-29 LAB — SARS CORONAVIRUS 2 BY RT PCR: SARS Coronavirus 2 by RT PCR: NEGATIVE

## 2021-07-29 NOTE — ED Triage Notes (Addendum)
Reports body aches and not feeling well.  Patient has very flat affect.  Denires SI HI or depression but rocking back and forth in triage.Patient has schizo hx reports not taking meds.  Refuses to answer if he is seeing or hearing things that are not there.

## 2022-02-04 ENCOUNTER — Ambulatory Visit
Admission: EM | Admit: 2022-02-04 | Discharge: 2022-02-04 | Disposition: A | Discharge status: Home / Self Care | Payer: Self-pay

## 2022-02-04 DIAGNOSIS — M791 Myalgia, unspecified site: Secondary | ICD-10-CM

## 2022-02-04 NOTE — Discharge Instructions (Signed)
Take ibuprofen as needed. 

## 2022-02-04 NOTE — ED Triage Notes (Signed)
Pt reports he was feeling down for the past 2 days, fever, coughing,chillis, back pain runny nose. Took dayquil nyquil, mucinex and cough drops but only slight relief.
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
## 2022-06-29 ENCOUNTER — Other Ambulatory Visit: Payer: Self-pay

## 2022-06-29 ENCOUNTER — Emergency Department (HOSPITAL_COMMUNITY)
Admission: EM | Admit: 2022-06-29 | Discharge: 2022-06-29 | Disposition: A | Discharge status: Home / Self Care | Payer: Self-pay | Attending: Emergency Medicine | Admitting: Emergency Medicine

## 2022-06-29 ENCOUNTER — Encounter (HOSPITAL_COMMUNITY): Payer: Self-pay

## 2022-06-29 DIAGNOSIS — Z202 Contact with and (suspected) exposure to infections with a predominantly sexual mode of transmission: Secondary | ICD-10-CM | POA: Insufficient documentation

## 2022-06-29 LAB — URINALYSIS, ROUTINE W REFLEX MICROSCOPIC
Bilirubin Urine: NEGATIVE
Glucose, UA: NEGATIVE mg/dL
Hgb urine dipstick: NEGATIVE
Ketones, ur: NEGATIVE mg/dL
Nitrite: NEGATIVE
Protein, ur: NEGATIVE mg/dL
Specific Gravity, Urine: 1.023 (ref 1.005–1.030)
pH: 5 (ref 5.0–8.0)

## 2022-06-29 MED ORDER — METRONIDAZOLE 500 MG PO TABS
2000.0000 mg | ORAL_TABLET | Freq: Once | ORAL | Status: AC
  Administered 2022-06-29: 2000 mg via ORAL
  Filled 2022-06-29: qty 4

## 2022-06-29 NOTE — ED Triage Notes (Signed)
Reports partner told them they had trich and wants to be tested. Denies discharge or burning with urination.

## 2022-06-29 NOTE — ED Provider Notes (Signed)
[place] EMERGENCY DEPARTMENT AT [facility] Provider Note   CSN: [id number] Arrival date & time: 06/29/22  1140     History  Chief Complaint  Patient presents with   Exposure to STD    Joseph Atkinson is a 32 y.o. male.  Joseph Atkinson is a 32 y.o. male presents to the ED for concern for STI exposure.  Patient states that he was sexually active with her partner about 2 weeks ago and was recently told that they tested positive for trichomonas and he would like to be tested.  He denies any penile discharge, dysuria, urinary frequency, hematuria.  He has not had any testicular pain or swelling.  No abdominal pain, no other symptoms reported.  The history is provided by the patient.  Exposure to STD       Home Medications Prior to Admission medications   Medication Sig Start Date End Date Taking? Authorizing Provider  acetaminophen (TYLENOL) 325 MG tablet Take 2 tablets (650 mg total) by mouth every 6 (six) hours as needed. 04/05/18   Khatri, Hina, PA-C  benzonatate (TESSALON) 100 MG capsule Take 2 capsules (200 mg total) by mouth every 8 (eight) hours. 04/05/18   Khatri, Hina, PA-C  fluticasone (FLONASE) 50 MCG/ACT nasal spray Place 1 spray into both nostrils daily. 04/05/18   Khatri, Hina, PA-C  ibuprofen (ADVIL,MOTRIN) 600 MG tablet Take 1 tablet (600 mg total) by mouth every 6 (six) hours as needed. 06/26/16   Benjiman Core, MD  meloxicam (MOBIC) 15 MG tablet Take 1 tablet (15 mg total) by mouth daily. Take 1 daily with food. 01/12/16   Arthor Captain, PA-C  methocarbamol (ROBAXIN) 500 MG tablet Take 1 tablet (500 mg total) by mouth every 8 (eight) hours as needed for muscle spasms. 06/26/16   Benjiman Core, MD  ondansetron (ZOFRAN ODT) 4 MG disintegrating tablet Take 1 tablet (4 mg total) by mouth every 8 (eight) hours as needed for nausea or vomiting. 04/05/18   Khatri, Hina, PA-C  oseltamivir (TAMIFLU) 75 MG capsule Take 1 capsule (75 mg total) by mouth every 12  (twelve) hours. 04/05/18   Khatri, Hina, PA-C  PRESCRIPTION MEDICATION Inject 1 each into the muscle every 14 (fourteen) days. "Risperidone injection"    [provider]  risperiDONE (RISPERDAL) 2 MG tablet Take 2 mg by mouth at bedtime.    [provider]  sulfamethoxazole-trimethoprim (SEPTRA DS) 800-160 MG per tablet Take 1 tablet by mouth 2 (two) times daily. 09/06/13   Junious Silk, PA-C  traMADol (ULTRAM) 50 MG tablet Take 2 tablets (100 mg total) by mouth every 6 (six) hours. 05/10/15   Freeman Caldron, PA-C  traZODone (DESYREL) 100 MG tablet Take 150 mg by mouth at bedtime as needed for sleep.    [provider]      Allergies    Hydromorphone    Review of Systems   Review of Systems  Constitutional:  Negative for chills and fever.  Genitourinary:  Negative for dysuria, penile discharge, scrotal swelling and testicular pain.    Physical Exam Updated Vital Signs BP 111/68 (BP Location: Right Arm)   Pulse 63   Temp 98.1 F (36.7 C) (Oral)   Resp 16   Ht 5\' 11"  (1.803 m)   Wt 90.7 kg   SpO2 98%   BMI 27.89 kg/m  Physical Exam Vitals and nursing note reviewed. Exam conducted with a chaperone present.  Constitutional:      General: He is not in acute distress.  Appearance: Normal appearance. He is well-developed. He is not ill-appearing or diaphoretic.  HENT:     Head: Normocephalic and atraumatic.  Eyes:     General:        Right eye: No discharge.        Left eye: No discharge.  Pulmonary:     Effort: Pulmonary effort is normal. No respiratory distress.  Genitourinary:    Comments: Chaperone present during genital exam. No external genital lesions or inguinal lymphadenopathy noted. Penis appears normal without tenderness on exam, no discharge noted to urethral meatus. Bilateral testicles without tenderness or mass, no tenderness over the spermatic cord. Neurological:     Mental Status: He is alert and oriented to person, place, and  time.     Coordination: Coordination normal.  Psychiatric:        Mood and Affect: Mood normal.        Behavior: Behavior normal.     ED Results / Procedures / Treatments   Labs (all labs ordered are listed, but only abnormal results are displayed) Labs Reviewed  URINALYSIS, ROUTINE W REFLEX MICROSCOPIC - Abnormal; Notable for the following components:      Result Value   Leukocytes,Ua TRACE (*)    Bacteria, UA RARE (*)    All other components within normal limits  GC/CHLAMYDIA PROBE AMP ([facility]) NOT AT [facility]    EKG None  Radiology No results found.  Procedures Procedures    Medications Ordered in ED Medications - No data to display  ED Course/ Medical Decision Making/ A&P                            Medical Decision Making Amount and/or Complexity of Data Reviewed Labs: ordered.   32 year old male presents with STI exposure, reports partner tested positive for trichomonas.  Patient is asymptomatic, no findings on exam.  UA with WBCs present without visualized trichomonas, patient given one-time dose of Flagyl, GC/chlamydia testing pending.  Patient is aware he will be notified of any positive results and will need to follow-up for treatment notify any partners.  Discharged home in good condition.        Final Clinical Impression(s) / ED Diagnoses Final diagnoses:  STD exposure    Rx / DC Orders ED Discharge Orders     None         Legrand Rams 06/29/22 1544    Glynn Octave, MD 06/29/22 1714

## 2022-06-29 NOTE — ED Notes (Signed)
Pt verbalizes understanding of discharge instructions. Opportunity for questions and answers were provided. Pt discharged from the ED.   ?

## 2022-06-29 NOTE — Discharge Instructions (Signed)
You were treated today for trichomonas exposure.  You have gonorrhea and Chlamydia testing pending and will be called by phone if any of these results are positive, please do not resume sexual activity until you have received all of your test results and any necessary treatments.   Use protection to avoid exposure to sexually transmitted infections, you can follow-up with urgent care, primary care or the health department for further STI testing needs.

## 2022-06-30 LAB — GC/CHLAMYDIA PROBE AMP (~~LOC~~) NOT AT ARMC
Chlamydia: POSITIVE — AB
Comment: NEGATIVE
Comment: NORMAL
Neisseria Gonorrhea: NEGATIVE

## 2023-03-08 ENCOUNTER — Ambulatory Visit (INDEPENDENT_AMBULATORY_CARE_PROVIDER_SITE_OTHER): Payer: 59

## 2023-03-08 ENCOUNTER — Ambulatory Visit (HOSPITAL_COMMUNITY)
Admission: EM | Admit: 2023-03-08 | Discharge: 2023-03-08 | Disposition: A | Discharge status: Home / Self Care | Payer: 59 | Attending: Internal Medicine | Admitting: Internal Medicine

## 2023-03-08 ENCOUNTER — Encounter (HOSPITAL_COMMUNITY): Payer: Self-pay

## 2023-03-08 ENCOUNTER — Telehealth (HOSPITAL_COMMUNITY): Payer: Self-pay

## 2023-03-08 DIAGNOSIS — L02512 Cutaneous abscess of left hand: Secondary | ICD-10-CM | POA: Diagnosis not present

## 2023-03-08 DIAGNOSIS — Z113 Encounter for screening for infections with a predominantly sexual mode of transmission: Secondary | ICD-10-CM | POA: Insufficient documentation

## 2023-03-08 LAB — HIV ANTIBODY (ROUTINE TESTING W REFLEX): HIV Screen 4th Generation wRfx: NONREACTIVE

## 2023-03-08 MED ORDER — IBUPROFEN 600 MG PO TABS: 600.0000 mg | ORAL_TABLET | Freq: Four times a day (QID) | ORAL | 0 refills | Status: AC | PRN

## 2023-03-08 MED ORDER — AMOXICILLIN-POT CLAVULANATE 875-125 MG PO TABS: 1.0000 | ORAL_TABLET | Freq: Two times a day (BID) | ORAL | 0 refills | Status: DC

## 2023-03-08 MED ORDER — IBUPROFEN 600 MG PO TABS: 600.0000 mg | ORAL_TABLET | Freq: Four times a day (QID) | ORAL | 0 refills | Status: DC | PRN

## 2023-03-08 MED ORDER — LIDOCAINE HCL 2 % IJ SOLN
INTRAMUSCULAR | Status: AC
  Filled 2023-03-08: qty 20
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
## 2023-03-08 NOTE — Discharge Instructions (Addendum)
 Please do daily wound dressing changes with antibiotic ointment Please take antibiotics as directed You may take ibuprofen  as needed for pain We will call you with results if labs are abnormal Please abstain from sexual intercourse until lab results are available Please feel free to return to urgent care if you have worsening pain, swelling, discharge, fever or chills.
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
## 2023-03-09 LAB — CYTOLOGY, (ORAL, ANAL, URETHRAL) ANCILLARY ONLY
Chlamydia: NEGATIVE
Comment: NEGATIVE
Comment: NEGATIVE
Comment: NORMAL
Neisseria Gonorrhea: NEGATIVE
Trichomonas: NEGATIVE

## 2023-03-09 LAB — RPR: RPR Ser Ql: NONREACTIVE

## 2023-10-31 ENCOUNTER — Other Ambulatory Visit (HOSPITAL_COMMUNITY)
Admission: RE | Admit: 2023-10-31 | Discharge: 2023-10-31 | Disposition: A | Discharge status: Home / Self Care | Payer: Self-pay | Source: Ambulatory Visit | Attending: Physician Assistant | Admitting: Physician Assistant

## 2023-10-31 ENCOUNTER — Encounter: Payer: Self-pay | Admitting: Physician Assistant

## 2023-10-31 ENCOUNTER — Other Ambulatory Visit: Payer: Self-pay

## 2023-10-31 ENCOUNTER — Ambulatory Visit: Admitting: Physician Assistant

## 2023-10-31 VITALS — BP 112/65 | HR 72 | Ht 72.0 in | Wt 198.0 lb

## 2023-10-31 DIAGNOSIS — R369 Urethral discharge, unspecified: Secondary | ICD-10-CM

## 2023-10-31 DIAGNOSIS — R0789 Other chest pain: Secondary | ICD-10-CM

## 2023-10-31 MED ORDER — METRONIDAZOLE 500 MG PO TABS
500.0000 mg | ORAL_TABLET | Freq: Two times a day (BID) | ORAL | 0 refills | Status: AC
  Filled 2023-10-31: qty 14, 7d supply, fill #0

## 2023-10-31 MED ORDER — CEFTRIAXONE SODIUM 500 MG IJ SOLR
500.0000 mg | Freq: Once | INTRAMUSCULAR | Status: AC
  Administered 2023-10-31: 500 mg via INTRAMUSCULAR

## 2023-10-31 MED ORDER — DOXYCYCLINE HYCLATE 100 MG PO CAPS
100.0000 mg | ORAL_CAPSULE | Freq: Two times a day (BID) | ORAL | 0 refills | Status: AC
  Filled 2023-10-31: qty 14, 7d supply, fill #0

## 2023-10-31 NOTE — Patient Instructions (Addendum)
 VISIT SUMMARY:  Today, you came in for a physical exam and STD screening due to experiencing white discharge. You also mentioned having intermittent sharp chest pain under your right breast for the past three years. We discussed your symptoms, initiated treatment, and planned further diagnostic tests.  YOUR PLAN:  -URETHRAL DISCHARGE: You have been experiencing white discharge for one week. This could be due to infections like gonorrhea, chlamydia, or trichomonas. We gave you a Rocephin  injection and prescribed metronidazole  and doxycycline  to treat potential infections. We also ordered urine tests for gonorrhea, chlamydia, and trichomonas, and blood tests for HIV and syphilis.   -CHEST PAIN, INTERMITTENT: You have been having sharp chest pain under your right breast intermittently for three years. Since there are no immediate concerns for serious conditions, we advised you to track the episodes, noting the timing, duration, and any potential triggers or activities associated with the pain.  Nonspecific Chest Pain, Adult Chest pain is an uncomfortable, tight, or painful feeling in the chest. The pain can feel like a crushing, aching, or squeezing pressure. A person can feel a burning or tingling sensation. Chest pain can also be felt in your back, neck, jaw, shoulder, or arm. This pain can be worse when you move, sneeze, or take a deep breath. Chest pain can be caused by a condition that is life-threatening. This must be treated right away. It can also be caused by something that is not life-threatening. If you have chest pain, it can be hard to know the difference, so it is important to get help right away to make sure that you do not have a serious condition. Some life-threatening causes of chest pain include: Heart attack. A tear in the body's main blood vessel (aortic dissection). Inflammation around your heart (pericarditis). A problem in the lungs, such as a blood clot (pulmonary embolism) or  a collapsed lung (pneumothorax). Some non life-threatening causes of chest pain include: Heartburn. Anxiety or stress. Damage to the bones, muscles, and cartilage that make up your chest wall. Pneumonia or bronchitis. Shingles infection (varicella-zoster virus). Your chest pain may come and go. It may also be constant. Your health care provider will do tests and other studies to find the cause of your pain. Treatment will depend on the cause of your chest pain. Follow these instructions at home: Medicines Take over-the-counter and prescription medicines only as told by your health care provider. If you were prescribed an antibiotic medicine, take it as told by your health care provider. Do not stop taking the antibiotic even if you start to feel better. Activity Avoid any activities that cause chest pain. Do not lift anything that is heavier than 10 lb (4.5 kg), or the limit that you are told, until your health care provider says that it is safe. Rest as directed by your health care provider. Return to your normal activities only as told by your health care provider. Ask your health care provider what activities are safe for you. Lifestyle     Do not use any products that contain nicotine or tobacco, such as cigarettes, e-cigarettes, and chewing tobacco. If you need help quitting, ask your health care provider. Do not drink alcohol. Make healthy lifestyle changes as recommended. These may include: Getting regular exercise. Ask your health care provider to suggest some exercises that are safe for you. Eating a heart-healthy diet. This includes plenty of fresh fruits and vegetables, whole grains, low-fat (lean) protein, and low-fat dairy products. A dietitian can help you find  healthy eating options. Maintaining a healthy weight. Managing any other health conditions you may have, such as high blood pressure (hypertension) or diabetes. Reducing stress, such as with yoga or relaxation  techniques. General instructions Pay attention to any changes in your symptoms. It is up to you to get the results of any tests that were done. Ask your health care provider, or the department that is doing the tests, when your results will be ready. Keep all follow-up visits as told by your health care provider. This is important. You may be asked to go for further testing if your chest pain does not go away. Contact a health care provider if: Your chest pain does not go away. You feel depressed. You have a fever. You notice changes in your symptoms or develop new symptoms. Get help right away if: Your chest pain gets worse. You have a cough that gets worse, or you cough up blood. You have severe pain in your abdomen. You faint. You have sudden, unexplained chest discomfort. You have sudden, unexplained discomfort in your arms, back, neck, or jaw. You have shortness of breath at any time. You suddenly start to sweat, or your skin gets clammy. You feel nausea or you vomit. You suddenly feel lightheaded or dizzy. You have severe weakness, or unexplained weakness or fatigue. Your heart begins to beat quickly, or it feels like it is skipping beats. These symptoms may represent a serious problem that is an emergency. Do not wait to see if the symptoms will go away. Get medical help right away. Call your local emergency services (911 in the [place] Do not drive yourself to the hospital. Summary Chest pain can be caused by a condition that is serious and requires urgent treatment. It may also be caused by something that is not life-threatening. Your health care provider may do lab tests and other studies to find the cause of your pain. Follow your health care provider's instructions on taking medicines, making lifestyle changes, and getting emergency treatment if symptoms become worse. Keep all follow-up visits as told by your health care provider. This includes visits for any further testing  if your chest pain does not go away. This information is not intended to replace advice given to you by your health care provider. Make sure you discuss any questions you have with your health care provider. Document Revised: 12/29/2021 Document Reviewed: 12/29/2021 Elsevier Patient Education  2024 [employer].

## 2023-10-31 NOTE — Progress Notes (Signed)
 New Patient Office Visit  Subjective    Patient ID: Joseph Atkinson, male    DOB: 15-Apr-1990  Age: 33 y.o. MRN: [id number]  CC:  Chief Complaint  Patient presents with   Penile Discharge    Patient explains white discharge. Denies any lesions or noticeable smell.    Discussed the use of AI scribe software for clinical note transcription with the patient, who gave verbal consent to proceed.  History of Present Illness   Joseph Atkinson is a 33 year old male who presents for a physical exam and STD screening due to white discharge.  He has experienced white discharge for approximately one week without burning, itching, sores, or lesions. He has a history of warts but is not currently experiencing any. About a year ago, he had them checked. He has not been informed of any specific exposures necessitating screening.  He experiences sharp chest pain under his right breast intermittently for about three years. The pain lasts for a few seconds and occurs sporadically, not more than once a week. It is described as a sharp pain that sometimes requires him to rub his chest. No associated symptoms such as shortness of breath or arm pain are present. No specific triggers or patterns are noted.    Outpatient Encounter Medications as of 10/31/2023  Medication Sig   doxycycline  (VIBRAMYCIN ) 100 MG capsule Take 1 capsule (100 mg total) by mouth 2 (two) times daily for 7 days.   metroNIDAZOLE  (FLAGYL ) 500 MG tablet Take 1 tablet (500 mg total) by mouth 2 (two) times daily for 7 days.   acetaminophen  (TYLENOL ) 325 MG tablet Take 2 tablets (650 mg total) by mouth every 6 (six) hours as needed.   ibuprofen  (ADVIL ) 600 MG tablet Take 1 tablet (600 mg total) by mouth every 6 (six) hours as needed. (Patient not taking: Reported on 10/31/2023)   risperiDONE  (RISPERDAL ) 2 MG tablet Take 2 mg by mouth at bedtime. (Patient not taking: Reported on 10/31/2023)   traZODone (DESYREL) 100 MG tablet Take 150 mg by mouth at  bedtime as needed for sleep. (Patient not taking: Reported on 10/31/2023)   [DISCONTINUED] amoxicillin -clavulanate (AUGMENTIN ) 875-125 MG tablet Take 1 tablet by mouth every 12 (twelve) hours. (Patient not taking: Reported on 10/31/2023)   [EXPIRED] cefTRIAXone  (ROCEPHIN ) injection 500 mg    No facility-administered encounter medications on file as of 10/31/2023.    Past Medical History:  Diagnosis Date   Attention deficit hyperactivity disorder (ADHD)    Bipolar affective (HCC)    Schizophrenia (HCC)     Past Surgical History:  Procedure Laterality Date   CYSTOSCOPY N/A 05/02/2015   Procedure: CYSTOSCOPY, SCROTAL EXPLORATION, CUTANEOUS URETHROSTOMY..;  Surgeon: Mark Ottelin, MD;  Location: [facility] OR;  Service: Urology;  Laterality: N/A;   KNEE ARTHROSCOPY     LAPAROTOMY N/A 05/02/2015   Procedure: EXPLORATORY LAPAROTOMY, SMALL BOWEL RESECTION, RIGHT COLOECTOMY, COLONORRHAPHY;  Surgeon: Lynda Leos, MD;  Location: [facility] OR;  Service: General;  Laterality: N/A;    Family History  Problem Relation Age of Onset   Cancer Father     Social History   Socioeconomic History   Marital status: Single    Spouse name: Not on file   Number of children: Not on file   Years of education: Not on file   Highest education level: Not on file  Occupational History   Not on file  Tobacco Use   Smoking status: Some Days    Current packs/day: 0.25    Types:  Cigarettes   Smokeless tobacco: Never  Vaping Use   Vaping status: Never Used  Substance and Sexual Activity   Alcohol use: Yes   Drug use: No   Sexual activity: Never  Other Topics Concern   Not on file  Social History Narrative   ** Merged History Encounter **       Social Drivers of Corporate investment banker Strain: Not on file  Food Insecurity: Not on file  Transportation Needs: Not on file  Physical Activity: Not on file  Stress: Not on file  Social Connections: Not on file  Intimate Partner Violence: Not on file    Review of  Systems  Constitutional:  Negative for chills and fever.  HENT: Negative.    Eyes: Negative.   Respiratory:  Negative for shortness of breath.   Cardiovascular:  Positive for chest pain.  Gastrointestinal:  Negative for nausea and vomiting.  Genitourinary:  Negative for dysuria, frequency and hematuria.  Musculoskeletal: Negative.   Skin: Negative.   Neurological: Negative.   Endo/Heme/Allergies: Negative.   Psychiatric/Behavioral: Negative.          Objective    BP 112/65 (BP Location: Left Arm, Patient Position: Sitting, Cuff Size: Large)   Pulse 72   Ht 6' (1.829 m)   Wt 198 lb (89.8 kg)   SpO2 98%   BMI 26.85 kg/m   Physical Exam Vitals and nursing note reviewed. Exam conducted with a chaperone present.  Constitutional:      Appearance: Normal appearance.  HENT:     Head: Normocephalic and atraumatic.     Right Ear: External ear normal.     Left Ear: External ear normal.     Nose: Nose normal.     Mouth/Throat:     Mouth: Mucous membranes are moist.     Pharynx: Oropharynx is clear.  Eyes:     Extraocular Movements: Extraocular movements intact.     Conjunctiva/sclera: Conjunctivae normal.     Pupils: Pupils are equal, round, and reactive to light.  Cardiovascular:     Rate and Rhythm: Normal rate and regular rhythm.     Pulses: Normal pulses.     Heart sounds: Normal heart sounds.  Pulmonary:     Effort: Pulmonary effort is normal.     Breath sounds: Normal breath sounds.  Genitourinary:    Pubic Area: No rash.      Penis: Normal. No tenderness, discharge, swelling or lesions.   Musculoskeletal:        General: Normal range of motion.     Cervical back: Normal range of motion and neck supple.  Skin:    General: Skin is warm and dry.  Neurological:     General: No focal deficit present.     Mental Status: He is alert and oriented to person, place, and time.  Psychiatric:        Mood and Affect: Mood normal.        Behavior: Behavior normal.         Thought Content: Thought content normal.        Judgment: Judgment normal.       Assessment & Plan:   Problem List Items Addressed This Visit   None Visit Diagnoses       Penile discharge    -  Primary   Relevant Medications   doxycycline  (VIBRAMYCIN ) 100 MG capsule   metroNIDAZOLE  (FLAGYL ) 500 MG tablet   cefTRIAXone  (ROCEPHIN ) injection 500 mg (Completed)   Other Relevant  Orders   RPR   HIV antibody (with reflex)   Urine cytology ancillary only     Atypical chest pain          Assessment and Plan Urethral discharge White urethral discharge for one week. Differential includes gonorrhea, chlamydia, and trichomonas. Empirical treatment initiated with diagnostic confirmation planned. - Administered Rocephin  injection. - Prescribed metronidazole  500 mg orally twice daily for seven days. - Prescribed doxycycline  100 mg orally twice daily for seven days. - Ordered urine tests for gonorrhea, chlamydia, and trichomonas. - Ordered blood tests for HIV and syphilis. - Sent prescriptions to [facility] pharmacy for cost efficiency. - Chest pain, intermittent Intermittent sharp chest pain under the right breast for three years. No immediate concerns for serious conditions. - Instructed to track episodes of chest pain, noting timing, duration, and any potential triggers or associated activities. Red flags given for prompt reevaluation    I have reviewed the patient's medical history (PMH, PSH, Social History, Family History, Medications, and allergies) , and have been updated if relevant. I spent 30 minutes reviewing chart and  face to face time with patient.   Return if symptoms worsen or fail to improve.   Kirk RAMAN Mayers, PA-C

## 2023-11-01 LAB — URINE CYTOLOGY ANCILLARY ONLY
Chlamydia: POSITIVE — AB
Comment: NEGATIVE
Comment: NEGATIVE
Comment: NORMAL
Neisseria Gonorrhea: NEGATIVE
Trichomonas: NEGATIVE

## 2023-11-01 LAB — RPR: RPR Ser Ql: NONREACTIVE

## 2023-11-01 LAB — HIV ANTIBODY (ROUTINE TESTING W REFLEX): HIV Screen 4th Generation wRfx: NONREACTIVE

## 2023-11-05 ENCOUNTER — Ambulatory Visit: Payer: Self-pay | Admitting: Physician Assistant
